# Patient Record
Sex: Female | Born: 1950
Health system: Southern US, Community
[De-identification: ages and names within clinical notes are randomized; demographics above are authoritative.]

## PROBLEM LIST (undated history)

## (undated) DIAGNOSIS — D649 Anemia, unspecified: Secondary | ICD-10-CM

## (undated) DIAGNOSIS — E6609 Other obesity due to excess calories: Secondary | ICD-10-CM

## (undated) DIAGNOSIS — I10 Essential (primary) hypertension: Secondary | ICD-10-CM

## (undated) DIAGNOSIS — E785 Hyperlipidemia, unspecified: Secondary | ICD-10-CM

## (undated) DIAGNOSIS — N951 Menopausal and female climacteric states: Secondary | ICD-10-CM

## (undated) HISTORY — DX: Other obesity due to excess calories: E66.09

## (undated) HISTORY — DX: Hyperlipidemia, unspecified: E78.5

## (undated) HISTORY — DX: Menopausal and female climacteric states: N95.1

## (undated) HISTORY — DX: Anemia, unspecified: D64.9

## (undated) HISTORY — DX: Essential (primary) hypertension: I10

---

## 1976-11-08 HISTORY — PX: CHOLECYSTECTOMY: SHX55

## 2006-07-25 ENCOUNTER — Other Ambulatory Visit: Admission: RE | Admit: 2006-07-25 | Discharge: 2006-07-25 | Payer: Self-pay | Admitting: Family Medicine

## 2006-07-25 ENCOUNTER — Ambulatory Visit: Payer: Self-pay | Admitting: Family Medicine

## 2007-04-17 ENCOUNTER — Ambulatory Visit: Payer: Self-pay | Admitting: Family Medicine

## 2007-10-02 ENCOUNTER — Ambulatory Visit: Payer: Self-pay | Admitting: Family Medicine

## 2007-10-04 ENCOUNTER — Ambulatory Visit: Payer: Self-pay | Admitting: Family Medicine

## 2008-07-01 ENCOUNTER — Ambulatory Visit: Payer: Self-pay | Admitting: Family Medicine

## 2008-12-23 ENCOUNTER — Ambulatory Visit: Payer: Self-pay | Admitting: Family Medicine

## 2009-09-01 ENCOUNTER — Ambulatory Visit: Payer: Self-pay | Admitting: Family Medicine

## 2010-04-13 ENCOUNTER — Other Ambulatory Visit: Admission: RE | Admit: 2010-04-13 | Discharge: 2010-04-13 | Payer: Self-pay | Admitting: Family Medicine

## 2010-04-13 ENCOUNTER — Ambulatory Visit: Payer: Self-pay | Admitting: Family Medicine

## 2010-04-14 LAB — HM PAP SMEAR: HM Pap smear: NEGATIVE

## 2010-04-27 ENCOUNTER — Ambulatory Visit: Payer: Self-pay | Admitting: Family Medicine

## 2010-09-14 ENCOUNTER — Ambulatory Visit: Payer: Self-pay | Admitting: Family Medicine

## 2010-10-19 ENCOUNTER — Ambulatory Visit: Payer: Self-pay | Admitting: Family Medicine

## 2011-07-10 ENCOUNTER — Other Ambulatory Visit: Payer: Self-pay | Admitting: Family Medicine

## 2011-07-26 ENCOUNTER — Telehealth: Payer: Self-pay

## 2011-07-26 NOTE — Telephone Encounter (Signed)
Pt coming in monday

## 2011-07-29 ENCOUNTER — Encounter: Payer: Self-pay | Admitting: Family Medicine

## 2011-08-02 ENCOUNTER — Ambulatory Visit (INDEPENDENT_AMBULATORY_CARE_PROVIDER_SITE_OTHER): Payer: BC Managed Care – PPO | Admitting: Family Medicine

## 2011-08-02 ENCOUNTER — Encounter: Payer: Self-pay | Admitting: Family Medicine

## 2011-08-02 DIAGNOSIS — E1169 Type 2 diabetes mellitus with other specified complication: Secondary | ICD-10-CM

## 2011-08-02 DIAGNOSIS — E785 Hyperlipidemia, unspecified: Secondary | ICD-10-CM

## 2011-08-02 DIAGNOSIS — Z0000 Encounter for general adult medical examination without abnormal findings: Secondary | ICD-10-CM

## 2011-08-02 DIAGNOSIS — E1159 Type 2 diabetes mellitus with other circulatory complications: Secondary | ICD-10-CM | POA: Insufficient documentation

## 2011-08-02 DIAGNOSIS — I152 Hypertension secondary to endocrine disorders: Secondary | ICD-10-CM

## 2011-08-02 DIAGNOSIS — I10 Essential (primary) hypertension: Secondary | ICD-10-CM

## 2011-08-02 DIAGNOSIS — E119 Type 2 diabetes mellitus without complications: Secondary | ICD-10-CM | POA: Insufficient documentation

## 2011-08-02 DIAGNOSIS — Z2911 Encounter for prophylactic immunotherapy for respiratory syncytial virus (RSV): Secondary | ICD-10-CM

## 2011-08-02 DIAGNOSIS — Z23 Encounter for immunization: Secondary | ICD-10-CM

## 2011-08-02 DIAGNOSIS — Z862 Personal history of diseases of the blood and blood-forming organs and certain disorders involving the immune mechanism: Secondary | ICD-10-CM | POA: Insufficient documentation

## 2011-08-02 DIAGNOSIS — Z79899 Other long term (current) drug therapy: Secondary | ICD-10-CM

## 2011-08-02 LAB — POCT URINALYSIS DIPSTICK
Bilirubin, UA: NEGATIVE
Blood, UA: NEGATIVE
Ketones, UA: NEGATIVE
Nitrite, UA: NEGATIVE
pH, UA: 7

## 2011-08-02 LAB — CBC WITH DIFFERENTIAL/PLATELET
Basophils Absolute: 0 10*3/uL (ref 0.0–0.1)
Basophils Relative: 1 % (ref 0–1)
HCT: 36.1 % (ref 36.0–46.0)
Lymphocytes Relative: 37 % (ref 12–46)
Neutro Abs: 3.2 10*3/uL (ref 1.7–7.7)
Neutrophils Relative %: 54 % (ref 43–77)
Platelets: 307 10*3/uL (ref 150–400)
RDW: 13.8 % (ref 11.5–15.5)
WBC: 5.9 10*3/uL (ref 4.0–10.5)

## 2011-08-02 LAB — COMPREHENSIVE METABOLIC PANEL
ALT: 11 U/L (ref 0–35)
AST: 16 U/L (ref 0–37)
Albumin: 4.1 g/dL (ref 3.5–5.2)
Alkaline Phosphatase: 96 U/L (ref 39–117)
Calcium: 9.6 mg/dL (ref 8.4–10.5)
Chloride: 98 mEq/L (ref 96–112)
Potassium: 4.8 mEq/L (ref 3.5–5.3)
Sodium: 136 mEq/L (ref 135–145)

## 2011-08-02 LAB — LIPID PANEL: LDL Cholesterol: 78 mg/dL (ref 0–99)

## 2011-08-02 LAB — POCT UA - MICROALBUMIN
Albumin/Creatinine Ratio, Urine, POC: 17
Creatinine, POC: 117.8 mg/dL
Microalbumin Ur, POC: 20 mg/dL

## 2011-08-02 MED ORDER — PIOGLITAZONE HCL 45 MG PO TABS: 45.0000 mg | ORAL_TABLET | Freq: Every day | ORAL | Status: DC

## 2011-08-02 MED ORDER — METFORMIN HCL 850 MG PO TABS: 850.0000 mg | ORAL_TABLET | Freq: Two times a day (BID) | ORAL | Status: DC

## 2011-08-02 MED ORDER — CARVEDILOL 25 MG PO TABS: 25.0000 mg | ORAL_TABLET | Freq: Two times a day (BID) | ORAL | Status: DC

## 2011-08-02 NOTE — Progress Notes (Signed)
Subjective:    Patient ID: Christy Scott, female    DOB: 1951/02/23, 60 y.o.   MRN: [id number]  HPI She is here for complete examination. She has lost 18 pounds since her last visit. She states her blood sugars run in the low 130 range. She has not been taking her Actos plus regularly stating that it causes a tingling sensation in her hands. She continues on medications listed in the chart and states she is taking all the other meds as scheduled. She continues to drive a truck. Her activity level is limited. Social and family history are reviewed and are in the chart. She does not want a colonoscopy   Review of Systems  Constitutional: Negative.   HENT: Negative.   Eyes: Negative.   Respiratory: Negative.   Cardiovascular: Negative.   Gastrointestinal: Negative.   Genitourinary: Negative.   Musculoskeletal: Negative.   Skin: Negative.   Neurological: Negative.   Hematological: Negative.   Psychiatric/Behavioral: Negative.        Objective:   Physical Exam BP 142/92  Ht 5\' 3"  (1.6 m)  Wt 280 lb (127.007 kg)  BMI 49.60 kg/m2  General Appearance:    Alert, cooperative, no distress, appears stated age  Head:    Normocephalic, without obvious abnormality, atraumatic  Eyes:    PERRL, conjunctiva/corneas clear, EOM's intact, fundi    benign  Ears:    Normal TM's and external ear canals  Nose:   Nares normal, mucosa normal, no drainage or sinus   tenderness  Throat:   Lips, mucosa, and tongue normal; teeth and gums normal  Neck:   Supple, no lymphadenopathy;  thyroid:  no   enlargement/tenderness/nodules; no carotid   bruit or JVD  Back:    Spine nontender, no curvature, ROM normal, no CVA     tenderness  Lungs:     Clear to auscultation bilaterally without wheezes, rales or     ronchi; respirations unlabored  Chest Wall:    No tenderness or deformity   Heart:    Regular rate and rhythm, S1 and S2 normal, no murmur, rub   or gallop  Breast Exam:    Deferred to GYN  Abdomen:      Soft, non-tender, nondistended, normoactive bowel sounds,    no masses, no hepatosplenomegaly  Genitalia:    Deferred to GYN     Extremities:   No clubbing, cyanosis or edema  Pulses:   2+ and symmetric all extremities  Skin:   Skin color, texture, turgor normal, no rashes or lesions  Lymph nodes:   Cervical, supraclavicular, and axillary nodes normal  Neurologic:   CNII-XII intact, normal strength, sensation and gait; reflexes 2+ and symmetric throughout          Psych:   Normal mood, affect, hygiene and grooming.           Assessment & Plan:   1. Diabetes mellitus  POCT Urinalysis Dipstick, POCT HgB A1C, POCT UA - Microalbumin, pioglitazone (ACTOS) 45 MG tablet, metFORMIN (GLUCOPHAGE) 850 MG tablet, CBC with Differential, Comprehensive metabolic panel, Lipid Profile, Ambulatory referral to Ophthalmology  2. Morbid obesity  CBC with Differential, Comprehensive metabolic panel, Lipid Profile  3. Hyperlipidemia LDL goal <70  Lipid Profile  4. Hypertension associated with diabetes  CBC with Differential, Comprehensive metabolic panel, Lipid Profile, carvedilol (COREG) 25 MG tablet  5. History of anemia    6. Encounter for long-term (current) use of other medications  CBC with Differential, Comprehensive metabolic panel, Lipid Profile  7. Routine general medical examination at a health care facility

## 2011-08-02 NOTE — Patient Instructions (Addendum)
Make sure your blood sugar machine is accurate. Check your blood sugar 2 hours after some of your meals. Continue with her weight loss. Work on trying to lose 1 pound per week. Return here in one month for recheck on your blood pressure.

## 2011-08-03 ENCOUNTER — Telehealth: Payer: Self-pay

## 2011-08-03 NOTE — Telephone Encounter (Signed)
Called pt to inform her all labs look good

## 2011-11-05 ENCOUNTER — Other Ambulatory Visit: Payer: Self-pay | Admitting: Family Medicine

## 2011-11-13 ENCOUNTER — Other Ambulatory Visit: Payer: Self-pay | Admitting: Family Medicine

## 2012-01-06 ENCOUNTER — Other Ambulatory Visit: Payer: Self-pay | Admitting: Family Medicine

## 2012-01-21 ENCOUNTER — Other Ambulatory Visit: Payer: Self-pay | Admitting: Family Medicine

## 2012-03-02 ENCOUNTER — Other Ambulatory Visit: Payer: Self-pay | Admitting: Family Medicine

## 2012-03-07 ENCOUNTER — Other Ambulatory Visit: Payer: Self-pay | Admitting: Family Medicine

## 2012-03-11 ENCOUNTER — Other Ambulatory Visit: Payer: Self-pay | Admitting: Family Medicine

## 2012-03-18 ENCOUNTER — Other Ambulatory Visit: Payer: Self-pay | Admitting: Family Medicine

## 2012-04-24 ENCOUNTER — Ambulatory Visit (INDEPENDENT_AMBULATORY_CARE_PROVIDER_SITE_OTHER): Payer: BC Managed Care – PPO | Admitting: Family Medicine

## 2012-04-24 ENCOUNTER — Encounter: Payer: Self-pay | Admitting: Family Medicine

## 2012-04-24 VITALS — BP 150/90 | HR 68 | Ht 63.0 in | Wt 308.0 lb

## 2012-04-24 DIAGNOSIS — E785 Hyperlipidemia, unspecified: Secondary | ICD-10-CM

## 2012-04-24 DIAGNOSIS — I152 Hypertension secondary to endocrine disorders: Secondary | ICD-10-CM

## 2012-04-24 DIAGNOSIS — I10 Essential (primary) hypertension: Secondary | ICD-10-CM

## 2012-04-24 DIAGNOSIS — Z862 Personal history of diseases of the blood and blood-forming organs and certain disorders involving the immune mechanism: Secondary | ICD-10-CM

## 2012-04-24 DIAGNOSIS — E1169 Type 2 diabetes mellitus with other specified complication: Secondary | ICD-10-CM

## 2012-04-24 DIAGNOSIS — E119 Type 2 diabetes mellitus without complications: Secondary | ICD-10-CM

## 2012-04-24 MED ORDER — SITAGLIPTIN PHOS-METFORMIN HCL 50-1000 MG PO TABS: 1.0000 | ORAL_TABLET | Freq: Two times a day (BID) | ORAL | Status: DC

## 2012-04-24 MED ORDER — QUINAPRIL-HYDROCHLOROTHIAZIDE 20-12.5 MG PO TABS: 1.0000 | ORAL_TABLET | Freq: Every day | ORAL | Status: DC

## 2012-04-24 NOTE — Patient Instructions (Addendum)
Stop the Januvia, metformin, hydrochlorothiazide and quinapril and usually her new prescriptions. Call us if you have any questions concerning the right medications to be taking.

## 2012-04-24 NOTE — Progress Notes (Signed)
Subjective:    Patient ID: Christy Scott, female    DOB: [birth year], 61 y.o.   MRN: [id number]  HPI She is here for a complete examination. Her weight has gone up slightly. She continues to work as a Naval architect and therefore her physical activity levels are quite minimal. Her eating habits about this and not changed much. He cannot see a dietitian due to her work schedule and being unable to have the schedule. She does intermittently check her feet. Her social and family history is unchanged. She is considering retiring within the next several years. She did injure her left hand in a fall but has no swelling or deformity.   Review of Systems  Constitutional: Negative.   HENT: Negative.   Eyes: Negative.   Respiratory: Negative.   Cardiovascular: Negative.   Gastrointestinal: Negative.   Genitourinary: Negative.   Musculoskeletal: Negative.   Neurological: Negative.   Hematological: Negative.  Negative for adenopathy.  Psychiatric/Behavioral: Negative.        Objective:   Physical Exam BP 150/90  Pulse 68  Ht 5\' 3"  (1.6 m)  Wt 308 lb (139.708 kg)  BMI 54.56 kg/m2  SpO2 98%  General Appearance:    Alert, cooperative, no distress, appears stated age  Head:    Normocephalic, without obvious abnormality, atraumatic  Eyes:    PERRL, conjunctiva/corneas clear, EOM's intact, fundi    benign  Ears:    Normal TM's and external ear canals  Nose:   Nares normal, mucosa normal, no drainage or sinus   tenderness  Throat:   Lips, mucosa, and tongue normal; teeth and gums normal  Neck:   Supple, no lymphadenopathy;  thyroid:  no   enlargement/tenderness/nodules; no carotid   bruit or JVD  Back:    Spine nontender, no curvature, ROM normal, no CVA     tenderness  Lungs:     Clear to auscultation bilaterally without wheezes, rales or     ronchi; respirations unlabored  Chest Wall:    No tenderness or deformity   Heart:    Regular rate and rhythm, S1 and S2 normal, no murmur, rub   or  gallop  Breast Exam:    Deferred to GYN  Abdomen:     Soft, non-tender, nondistended, normoactive bowel sounds,    no masses, no hepatosplenomegaly  Genitalia:    Deferred to GYN     Extremities:   No clubbing, cyanosis or edema pulses normal   Pulses:   2+ and symmetric all extremities  Skin:   Skin color, texture, turgor normal, no rashes or lesions  Lymph nodes:   Cervical, supraclavicular, and axillary nodes normal  Neurologic:   CNII-XII intact, normal strength, sensation and gait; reflexes 2+ and symmetric throughout          Psych:   Normal mood, affect, hygiene and grooming.           Assessment & Plan:   1. Diabetes mellitus  sitaGLIPtan-metformin (JANUMET) 50-1000 MG per tablet, Ambulatory referral to Ophthalmology  2. Hyperlipidemia LDL goal <70    3. Hypertension associated with diabetes  quinapril-hydrochlorothiazide (ACCURETIC) 20-12.5 MG per tablet  4. Morbid obesity    5. History of anemia     I again discussed the need for her to make dietary changes as well as try to increase her physical activities. I will set her up for ophthalmology evaluation. No therapy for the left hand. I did renew her medications and combined as many as I could.  Told her to call she has no questions about correct dosing of her medicines.

## 2012-05-21 ENCOUNTER — Other Ambulatory Visit: Payer: Self-pay | Admitting: Family Medicine

## 2012-06-19 ENCOUNTER — Ambulatory Visit (INDEPENDENT_AMBULATORY_CARE_PROVIDER_SITE_OTHER): Payer: BC Managed Care – PPO | Admitting: Family Medicine

## 2012-06-19 VITALS — BP 146/90 | HR 70 | Wt 292.0 lb

## 2012-06-19 DIAGNOSIS — I152 Hypertension secondary to endocrine disorders: Secondary | ICD-10-CM

## 2012-06-19 DIAGNOSIS — I10 Essential (primary) hypertension: Secondary | ICD-10-CM

## 2012-06-19 DIAGNOSIS — E1169 Type 2 diabetes mellitus with other specified complication: Secondary | ICD-10-CM

## 2012-06-19 MED ORDER — QUINAPRIL-HYDROCHLOROTHIAZIDE 20-12.5 MG PO TABS: 2.0000 | ORAL_TABLET | ORAL | Status: DC

## 2012-06-19 NOTE — Progress Notes (Signed)
  Subjective:    Patient ID: Christy Scott, female    DOB: 1951/02/15, 61 y.o.   MRN: [id number]  HPI She is here for recheck. She has lost several pounds since her last visit. At this time she is not driving due to her blood pressure. She was to continue to drive for several more years. She continues on medications listed in the chart.   Review of Systems     Objective:   Physical Exam  Alert and in no distress otherwise not examined      Assessment & Plan:   1. Hypertension associated with diabetes  quinapril-hydrochlorothiazide (ACCURETIC) 20-12.5 MG per tablet   I will double her  Accuretic. She is to return her overall medical condition and also to keep her from any not requiring insulin.here in 2 weeks for recheck on her blood pressure. Again encouraged her to continue with her weight loss to help with

## 2012-07-03 ENCOUNTER — Ambulatory Visit (INDEPENDENT_AMBULATORY_CARE_PROVIDER_SITE_OTHER): Payer: BC Managed Care – PPO | Admitting: Family Medicine

## 2012-07-03 VITALS — BP 130/80 | HR 74 | Wt 307.0 lb

## 2012-07-03 DIAGNOSIS — E1169 Type 2 diabetes mellitus with other specified complication: Secondary | ICD-10-CM

## 2012-07-03 DIAGNOSIS — I10 Essential (primary) hypertension: Secondary | ICD-10-CM

## 2012-07-03 DIAGNOSIS — I152 Hypertension secondary to endocrine disorders: Secondary | ICD-10-CM

## 2012-07-03 NOTE — Progress Notes (Signed)
  Subjective:    Patient ID: Christy Scott, female    DOB: 15-Jul-1951, 61 y.o.   MRN: [id number]  HPI She is here for blood pressure recheck. On her last visit we doubled her Accuretic dosing. She is having no difficulty.   Review of Systems     Objective:   Physical Exam Alert and in no distress. Blood pressure is recorded.       Assessment & Plan:  Hypertension. Continue present medication regimen. Recheck here in 4 months.

## 2012-07-03 NOTE — Patient Instructions (Signed)
Stay on your present medications and keep working on the weight

## 2012-07-12 ENCOUNTER — Telehealth: Payer: Self-pay

## 2012-07-12 NOTE — Telephone Encounter (Signed)
Called pt to inform her that the aflac papers needs to sent to the Dr. 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

## 2012-07-17 ENCOUNTER — Other Ambulatory Visit: Payer: Self-pay | Admitting: Family Medicine

## 2012-08-24 ENCOUNTER — Other Ambulatory Visit: Payer: Self-pay | Admitting: Family Medicine

## 2012-10-11 ENCOUNTER — Other Ambulatory Visit: Payer: Self-pay | Admitting: Family Medicine

## 2012-11-07 ENCOUNTER — Other Ambulatory Visit: Payer: Self-pay | Admitting: Family Medicine

## 2012-11-13 ENCOUNTER — Ambulatory Visit: Payer: BC Managed Care – PPO | Admitting: Family Medicine

## 2012-11-28 ENCOUNTER — Telehealth: Payer: Self-pay | Admitting: Internal Medicine

## 2012-11-28 NOTE — Telephone Encounter (Signed)
i called pt, Pt needs to schedule a diabetes check with Dr. Susann Givens

## 2012-12-03 ENCOUNTER — Other Ambulatory Visit: Payer: Self-pay | Admitting: Family Medicine

## 2012-12-25 ENCOUNTER — Ambulatory Visit: Payer: BC Managed Care – PPO | Admitting: Family Medicine

## 2013-01-22 ENCOUNTER — Encounter: Payer: Self-pay | Admitting: Family Medicine

## 2013-01-22 ENCOUNTER — Ambulatory Visit: Payer: BC Managed Care – PPO | Admitting: Family Medicine

## 2013-01-22 VITALS — BP 132/86 | HR 71 | Wt 307.0 lb

## 2013-01-22 DIAGNOSIS — E785 Hyperlipidemia, unspecified: Secondary | ICD-10-CM

## 2013-01-22 DIAGNOSIS — Z862 Personal history of diseases of the blood and blood-forming organs and certain disorders involving the immune mechanism: Secondary | ICD-10-CM

## 2013-01-22 DIAGNOSIS — E119 Type 2 diabetes mellitus without complications: Secondary | ICD-10-CM

## 2013-01-22 DIAGNOSIS — Z79899 Other long term (current) drug therapy: Secondary | ICD-10-CM

## 2013-01-22 DIAGNOSIS — I10 Essential (primary) hypertension: Secondary | ICD-10-CM

## 2013-01-22 DIAGNOSIS — E1159 Type 2 diabetes mellitus with other circulatory complications: Secondary | ICD-10-CM

## 2013-01-22 DIAGNOSIS — I152 Hypertension secondary to endocrine disorders: Secondary | ICD-10-CM

## 2013-01-22 DIAGNOSIS — E1169 Type 2 diabetes mellitus with other specified complication: Secondary | ICD-10-CM

## 2013-01-22 LAB — COMPREHENSIVE METABOLIC PANEL
ALT: 11 U/L (ref 0–35)
AST: 14 U/L (ref 0–37)
CO2: 29 mEq/L (ref 19–32)
Calcium: 9.2 mg/dL (ref 8.4–10.5)
Chloride: 103 mEq/L (ref 96–112)
Creat: 0.78 mg/dL (ref 0.50–1.10)
Sodium: 140 mEq/L (ref 135–145)
Total Protein: 6.9 g/dL (ref 6.0–8.3)

## 2013-01-22 LAB — LIPID PANEL
HDL: 64 mg/dL (ref >39)
LDL Cholesterol: 152 mg/dL — ABNORMAL HIGH (ref 0–99)
Total CHOL/HDL Ratio: 3.7 Ratio
VLDL: 22 mg/dL (ref 0–40)

## 2013-01-22 LAB — CBC WITH DIFFERENTIAL/PLATELET
Basophils Absolute: 0 10*3/uL (ref 0.0–0.1)
Eosinophils Relative: 2 % (ref 0–5)
Lymphocytes Relative: 39 % (ref 12–46)
Lymphs Abs: 1.9 10*3/uL (ref 0.7–4.0)
Neutro Abs: 2.5 10*3/uL (ref 1.7–7.7)
Neutrophils Relative %: 52 % (ref 43–77)
Platelets: 292 10*3/uL (ref 150–400)
RBC: 3.66 MIL/uL — ABNORMAL LOW (ref 3.87–5.11)
RDW: 15.8 % — ABNORMAL HIGH (ref 11.5–15.5)
WBC: 4.8 10*3/uL (ref 4.0–10.5)

## 2013-01-22 LAB — POCT GLYCOSYLATED HEMOGLOBIN (HGB A1C): Hemoglobin A1C: 6.7

## 2013-01-22 LAB — POCT UA - MICROALBUMIN: Microalbumin Ur, POC: 14.7 mg/dL

## 2013-01-22 MED ORDER — QUINAPRIL-HYDROCHLOROTHIAZIDE 20-12.5 MG PO TABS: 2.0000 | ORAL_TABLET | ORAL | Status: DC

## 2013-01-22 MED ORDER — PIOGLITAZONE HCL 45 MG PO TABS: ORAL_TABLET | ORAL | Status: DC

## 2013-01-22 MED ORDER — ROSUVASTATIN CALCIUM 10 MG PO TABS: 10.0000 mg | ORAL_TABLET | Freq: Every day | ORAL | Status: DC

## 2013-01-22 MED ORDER — CARVEDILOL 25 MG PO TABS: ORAL_TABLET | ORAL | Status: DC

## 2013-01-22 MED ORDER — SITAGLIPTIN PHOS-METFORMIN HCL 50-1000 MG PO TABS: ORAL_TABLET | ORAL | Status: DC

## 2013-01-22 NOTE — Progress Notes (Signed)
EYE EXAM : 12/2012 DR.ROTH FOOT EXAM: 04/2012 [facility] PATIENT TEST B/S 1 TIME A DAY B/S READING : 119 OR SO

## 2013-01-22 NOTE — Patient Instructions (Addendum)
Work up to a half an hour of physical activity out of your work day.entirely back on carbohydrates. Remember about "white food". Get out to information that the nutritionist gave you and review it and then implement it! Try the Crestor and let me know if you have trouble with the aches and pains.

## 2013-01-22 NOTE — Progress Notes (Signed)
  Subjective:    Patient ID: Christy Scott, female    DOB: 07/15/1951, 62 y.o.   MRN: [id number]  HPI She is here for a followup diabetes visit. Her weight is stable. Blood pressure is stable. She has had eye exam as well as foot exams. She does test her blood sugars fairly regularly. Her dietary habits are unchanged. She has been through counseling for this. Her exercise is quite minimal. Social history was reviewed and is unchanged. She stopped taking her statin stating it caused muscle aches and pains.  Review of Systems     Objective:   Physical Exam Alert and in no distress. Hemoglobin A1c is 6.7.       Assessment & Plan:  Diabetes mellitus - Plan: POCT glycosylated hemoglobin (Hb A1C), Lipid panel, CBC with Differential, Comprehensive metabolic panel, POCT UA - Microalbumin, sitaGLIPtan-metformin (JANUMET) 50-1000 MG per tablet, pioglitazone (ACTOS) 45 MG tablet  Morbid obesity  Hyperlipidemia LDL goal <70 - Plan: Lipid panel, rosuvastatin (CRESTOR) 10 MG tablet  Hypertension associated with diabetes - Plan: CBC with Differential, Comprehensive metabolic panel, quinapril-hydrochlorothiazide (ACCURETIC) 20-12.5 MG per tablet, carvedilol (COREG) 25 MG tablet  History of anemia  Encounter for long-term (current) use of other medications - Plan: Lipid panel, CBC with Differential, Comprehensive metabolic panel discussed diet and exercise with her. 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 A sample of Crestor given. She is to call me and let he know how she tolerates this.

## 2013-01-23 NOTE — Progress Notes (Signed)
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

## 2013-01-29 ENCOUNTER — Telehealth: Payer: Self-pay | Admitting: Family Medicine

## 2013-01-29 DIAGNOSIS — E785 Hyperlipidemia, unspecified: Secondary | ICD-10-CM

## 2013-01-29 MED ORDER — ROSUVASTATIN CALCIUM 10 MG PO TABS: 10.0000 mg | ORAL_TABLET | Freq: Every day | ORAL | Status: DC

## 2013-01-29 NOTE — Telephone Encounter (Signed)
Sent in crestor.  

## 2013-01-29 NOTE — Telephone Encounter (Signed)
PT CALLED AND STATED THAT CRESTOR SEEMS TO BE WORKING OK. SHE WOULD LIKE A RX SENT INTO [facility].

## 2013-01-30 ENCOUNTER — Other Ambulatory Visit: Payer: Self-pay

## 2013-01-30 DIAGNOSIS — E785 Hyperlipidemia, unspecified: Secondary | ICD-10-CM

## 2013-01-30 MED ORDER — ROSUVASTATIN CALCIUM 10 MG PO TABS: 10.0000 mg | ORAL_TABLET | Freq: Every day | ORAL | Status: DC

## 2013-05-24 ENCOUNTER — Other Ambulatory Visit: Payer: Self-pay | Admitting: Family Medicine

## 2013-12-26 ENCOUNTER — Telehealth: Payer: Self-pay | Admitting: Family Medicine

## 2013-12-26 DIAGNOSIS — E119 Type 2 diabetes mellitus without complications: Secondary | ICD-10-CM

## 2013-12-26 MED ORDER — PIOGLITAZONE HCL 45 MG PO TABS: ORAL_TABLET | ORAL | Status: DC

## 2013-12-26 NOTE — Telephone Encounter (Signed)
Pt needs refill for Actos to [facility].  Pt has med check scheduled for March 30th.

## 2013-12-29 ENCOUNTER — Other Ambulatory Visit: Payer: Self-pay | Admitting: Family Medicine

## 2014-02-03 ENCOUNTER — Other Ambulatory Visit: Payer: Self-pay | Admitting: Family Medicine

## 2014-02-04 ENCOUNTER — Encounter: Payer: BC Managed Care – PPO | Admitting: Family Medicine

## 2014-02-15 ENCOUNTER — Encounter: Payer: BC Managed Care – PPO | Admitting: Family Medicine

## 2014-02-16 ENCOUNTER — Other Ambulatory Visit: Payer: Self-pay | Admitting: Family Medicine

## 2014-03-04 ENCOUNTER — Ambulatory Visit (INDEPENDENT_AMBULATORY_CARE_PROVIDER_SITE_OTHER): Payer: BC Managed Care – PPO | Admitting: Family Medicine

## 2014-03-04 ENCOUNTER — Encounter: Payer: Self-pay | Admitting: Family Medicine

## 2014-03-04 VITALS — BP 148/90 | HR 72 | Wt 291.0 lb

## 2014-03-04 DIAGNOSIS — E119 Type 2 diabetes mellitus without complications: Secondary | ICD-10-CM

## 2014-03-04 DIAGNOSIS — E785 Hyperlipidemia, unspecified: Secondary | ICD-10-CM

## 2014-03-04 DIAGNOSIS — Z79899 Other long term (current) drug therapy: Secondary | ICD-10-CM

## 2014-03-04 DIAGNOSIS — I152 Hypertension secondary to endocrine disorders: Secondary | ICD-10-CM

## 2014-03-04 DIAGNOSIS — I10 Essential (primary) hypertension: Secondary | ICD-10-CM

## 2014-03-04 DIAGNOSIS — E1159 Type 2 diabetes mellitus with other circulatory complications: Secondary | ICD-10-CM

## 2014-03-04 DIAGNOSIS — Z91199 Patient's noncompliance with other medical treatment and regimen due to unspecified reason: Secondary | ICD-10-CM

## 2014-03-04 DIAGNOSIS — Z9119 Patient's noncompliance with other medical treatment and regimen: Secondary | ICD-10-CM

## 2014-03-04 DIAGNOSIS — E1169 Type 2 diabetes mellitus with other specified complication: Secondary | ICD-10-CM

## 2014-03-04 LAB — CBC WITH DIFFERENTIAL/PLATELET
Basophils Absolute: 0 10*3/uL (ref 0.0–0.1)
Basophils Relative: 0 % (ref 0–1)
EOS ABS: 0.1 10*3/uL (ref 0.0–0.7)
Eosinophils Relative: 2 % (ref 0–5)
HCT: 35.2 % — ABNORMAL LOW (ref 36.0–46.0)
Hemoglobin: 11.9 g/dL — ABNORMAL LOW (ref 12.0–15.0)
Lymphocytes Relative: 36 % (ref 12–46)
Lymphs Abs: 1.8 10*3/uL (ref 0.7–4.0)
MCH: 31.2 pg (ref 26.0–34.0)
MCHC: 33.8 g/dL (ref 30.0–36.0)
MCV: 92.1 fL (ref 78.0–100.0)
MONO ABS: 0.3 10*3/uL (ref 0.1–1.0)
Monocytes Relative: 7 % (ref 3–12)
Neutro Abs: 2.7 10*3/uL (ref 1.7–7.7)
Neutrophils Relative %: 55 % (ref 43–77)
PLATELETS: 308 10*3/uL (ref 150–400)
RBC: 3.82 MIL/uL — ABNORMAL LOW (ref 3.87–5.11)
RDW: 15.7 % — AB (ref 11.5–15.5)
WBC: 4.9 10*3/uL (ref 4.0–10.5)

## 2014-03-04 LAB — COMPREHENSIVE METABOLIC PANEL
ALT: 11 U/L (ref 0–35)
AST: 13 U/L (ref 0–37)
Albumin: 4 g/dL (ref 3.5–5.2)
Alkaline Phosphatase: 84 U/L (ref 39–117)
BUN: 14 mg/dL (ref 6–23)
CO2: 28 meq/L (ref 19–32)
CREATININE: 0.74 mg/dL (ref 0.50–1.10)
Calcium: 9.7 mg/dL (ref 8.4–10.5)
Chloride: 102 mEq/L (ref 96–112)
Glucose, Bld: 209 mg/dL — ABNORMAL HIGH (ref 70–99)
Potassium: 4.1 mEq/L (ref 3.5–5.3)
Sodium: 140 mEq/L (ref 135–145)
Total Bilirubin: 0.6 mg/dL (ref 0.2–1.2)
Total Protein: 7.1 g/dL (ref 6.0–8.3)

## 2014-03-04 LAB — LIPID PANEL
CHOL/HDL RATIO: 3.1 ratio
CHOLESTEROL: 211 mg/dL — AB (ref 0–200)
HDL: 68 mg/dL (ref >39)
LDL Cholesterol: 121 mg/dL — ABNORMAL HIGH (ref 0–99)
Triglycerides: 109 mg/dL (ref <150)
VLDL: 22 mg/dL (ref 0–40)

## 2014-03-04 LAB — POCT GLYCOSYLATED HEMOGLOBIN (HGB A1C): Hemoglobin A1C: 8.4

## 2014-03-04 MED ORDER — SITAGLIPTIN PHOS-METFORMIN HCL 50-1000 MG PO TABS: ORAL_TABLET | ORAL | Status: DC

## 2014-03-04 MED ORDER — PIOGLITAZONE HCL 45 MG PO TABS: ORAL_TABLET | ORAL | Status: DC

## 2014-03-04 MED ORDER — QUINAPRIL-HYDROCHLOROTHIAZIDE 20-12.5 MG PO TABS: ORAL_TABLET | ORAL | Status: DC

## 2014-03-04 MED ORDER — CARVEDILOL 25 MG PO TABS: ORAL_TABLET | ORAL | Status: DC

## 2014-03-04 MED ORDER — SIMVASTATIN 20 MG PO TABS: 20.0000 mg | ORAL_TABLET | Freq: Every day | ORAL | Status: DC

## 2014-03-04 NOTE — Patient Instructions (Signed)
If you have any problems with the new medication and let me know

## 2014-03-04 NOTE — Progress Notes (Signed)
   Subjective:    Patient ID: Christy Scott, female    DOB: 02/18/1951, 63 y.o.   MRN: [id number]  HPI She is here for a recheck. She stopped taking Mr. because she had trouble with myalgias but did not call me concerning this and has therefore been off for several months. She also has only been taking 1 January the. She states that she was having difficulty with aches and pains in the evening and was blaming this on the second dosing of the medication. Her weight is down slightly. She missed several appointments due to work related problems specifically travel conditions. Smoking and drinking were reviewed. Her exercise is quite minimal. She is a Naval architecttruck driver. Her chart was reviewed. She has not seen her eye doctor in a while. She is not interested in having a colonoscopy.   Review of Systems Negative except as above    Objective:   Physical Exam Alert and in no distress. Hemoglobin A1c is 8.4.      Assessment & Plan:  Diabetes mellitus - Plan: sitaGLIPtin-metformin (JANUMET) 50-1000 MG per tablet, pioglitazone (ACTOS) 45 MG tablet, CBC with Differential, Comprehensive metabolic panel, Lipid panel, POCT glycosylated hemoglobin (Hb A1C), CANCELED: POCT UA - Microalbumin  Hyperlipidemia LDL goal <70 - Plan: simvastatin (ZOCOR) 20 MG tablet, Lipid panel  Hypertension associated with diabetes - Plan: carvedilol (COREG) 25 MG tablet, quinapril-hydrochlorothiazide (ACCURETIC) 20-12.5 MG per tablet  Morbid obesity  Encounter for long-term (current) use of other medications - Plan: CBC with Differential, Comprehensive metabolic panel, Lipid panel  Personal history of noncompliance with medical treatment, presenting hazards to health  Had a long discussion with her concerning her noncompliance and not letting me know that she stop medications. Strongly encouraged her to involve me in the decision-making she has problems with medications. Her medications were renewed. I will place her on Zocor.  She is to call me she has difficulty with that. If she does I will place her on Livalo.

## 2014-03-05 ENCOUNTER — Other Ambulatory Visit: Payer: Self-pay | Admitting: Family Medicine

## 2014-03-06 LAB — HM MAMMOGRAPHY: HM MAMMO: NEGATIVE

## 2014-03-12 ENCOUNTER — Encounter: Payer: BC Managed Care – PPO | Admitting: Family Medicine

## 2014-03-15 ENCOUNTER — Encounter: Payer: Self-pay | Admitting: Internal Medicine

## 2014-03-15 ENCOUNTER — Encounter: Payer: Self-pay | Admitting: Family Medicine

## 2014-08-29 ENCOUNTER — Other Ambulatory Visit: Payer: Self-pay | Admitting: Family Medicine

## 2014-09-10 ENCOUNTER — Ambulatory Visit: Payer: BC Managed Care – PPO | Admitting: Family Medicine

## 2014-09-30 ENCOUNTER — Encounter: Payer: Self-pay | Admitting: Family Medicine

## 2014-09-30 ENCOUNTER — Ambulatory Visit (INDEPENDENT_AMBULATORY_CARE_PROVIDER_SITE_OTHER): Payer: BC Managed Care – PPO | Admitting: Family Medicine

## 2014-09-30 VITALS — BP 130/90 | HR 86 | Wt 289.0 lb

## 2014-09-30 DIAGNOSIS — E119 Type 2 diabetes mellitus without complications: Secondary | ICD-10-CM

## 2014-09-30 DIAGNOSIS — I152 Hypertension secondary to endocrine disorders: Secondary | ICD-10-CM

## 2014-09-30 DIAGNOSIS — I10 Essential (primary) hypertension: Secondary | ICD-10-CM

## 2014-09-30 DIAGNOSIS — E1169 Type 2 diabetes mellitus with other specified complication: Secondary | ICD-10-CM

## 2014-09-30 DIAGNOSIS — E1159 Type 2 diabetes mellitus with other circulatory complications: Secondary | ICD-10-CM

## 2014-09-30 DIAGNOSIS — S63601A Unspecified sprain of right thumb, initial encounter: Secondary | ICD-10-CM

## 2014-09-30 NOTE — Progress Notes (Signed)
Subjective:     Patient ID: Christy Scott, female   DOB: 11-29-1950, 63 y.o.   MRN: [id number]  HPI  This is a 63 year old female with a history of T2DM & HTN who presents for swelling of her right hand.  Two weeks ago she was carrying bags of groceries to her car and jammed her right, extended thumb into the door.  She noticed worsening swelling with limited ROM last week.  She had minimal pain.  Her status has improved since then and is not noticing pain at this time.  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  Christy Scott is currently adherent to her medications, checking her feet daily, and checking her blood sugar at home.  She had her annual eye exam in April of 2015.  She is trying to adjust her diet by restricting her intake.  She does not have any pain, tingling, or numbness of her extremities. She does not smoke or drink. 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  Review of Systems  Per HPI  Medications, allergies, family history, and social history were reviewed and updated.  Objective:   Physical Exam  Filed Vitals:   09/30/14 1111  BP: 130/90  Pulse: 86    General: Pleasant in no acute distress. CV: Regular rate and rhythm without murmurs gallops or rubs Pulm: Clear to ascultation bilaterally with normal work of breathing. MSK/Extremities: Full range of motion of the thumb with normal strength of opposition to small finger.  No point tenderness over anatomical snuff box.  Point tenderness present over medial aspect of first metacarpal-trapezium joint on the right hand with swelling. Stressing the ulnar collateral ligament was slightly uncomfortable and she did have some slight fullness in that area  Labs today:  A1C 7.4 down from 8.4 in April 2015.     Assessment:     Christy Scott's symptoms and physical exam findings are consistent with a gamekeeper's injury  of the thumb.  Her exam at this point is benign and she should continue to improve.  Her diabetes control has improved with her continued medication compliance.  Her health maintenance is up to date. Again strongly encouraged her to make further diet and exercise changes.    Plan:  Thumb sprain, right, initial encounter - Continue conservative care with cold compresses, NSAIDs for pain.  Hypertension associated with diabetes - Continue current antihypertensives and reassess at next follow-up appointment.  Type 2 diabetes mellitus without complication - Continue current medications and diet modification.  Morbid obesity

## 2014-12-21 ENCOUNTER — Other Ambulatory Visit: Payer: Self-pay | Admitting: Family Medicine

## 2015-01-23 ENCOUNTER — Other Ambulatory Visit: Payer: Self-pay | Admitting: Family Medicine

## 2015-02-15 ENCOUNTER — Other Ambulatory Visit: Payer: Self-pay | Admitting: Family Medicine

## 2015-02-19 ENCOUNTER — Other Ambulatory Visit: Payer: Self-pay | Admitting: Family Medicine

## 2015-02-24 ENCOUNTER — Other Ambulatory Visit: Payer: Self-pay | Admitting: Family Medicine

## 2015-03-10 ENCOUNTER — Ambulatory Visit: Payer: Self-pay | Admitting: Family Medicine

## 2015-03-18 ENCOUNTER — Other Ambulatory Visit: Payer: Self-pay | Admitting: Family Medicine

## 2015-03-18 DIAGNOSIS — E119 Type 2 diabetes mellitus without complications: Secondary | ICD-10-CM

## 2015-03-20 ENCOUNTER — Other Ambulatory Visit: Payer: Self-pay | Admitting: Family Medicine

## 2015-04-17 ENCOUNTER — Other Ambulatory Visit: Payer: Self-pay | Admitting: Family Medicine

## 2015-05-05 ENCOUNTER — Ambulatory Visit (INDEPENDENT_AMBULATORY_CARE_PROVIDER_SITE_OTHER): Payer: No Typology Code available for payment source | Admitting: Family Medicine

## 2015-05-05 ENCOUNTER — Encounter: Payer: Self-pay | Admitting: Family Medicine

## 2015-05-05 VITALS — BP 160/100 | HR 67 | Ht 63.5 in | Wt 299.0 lb

## 2015-05-05 DIAGNOSIS — Z23 Encounter for immunization: Secondary | ICD-10-CM | POA: Diagnosis not present

## 2015-05-05 DIAGNOSIS — I10 Essential (primary) hypertension: Secondary | ICD-10-CM | POA: Diagnosis not present

## 2015-05-05 DIAGNOSIS — E1159 Type 2 diabetes mellitus with other circulatory complications: Secondary | ICD-10-CM

## 2015-05-05 DIAGNOSIS — I152 Hypertension secondary to endocrine disorders: Secondary | ICD-10-CM

## 2015-05-05 DIAGNOSIS — E119 Type 2 diabetes mellitus without complications: Secondary | ICD-10-CM

## 2015-05-05 DIAGNOSIS — Z862 Personal history of diseases of the blood and blood-forming organs and certain disorders involving the immune mechanism: Secondary | ICD-10-CM | POA: Diagnosis not present

## 2015-05-05 DIAGNOSIS — E785 Hyperlipidemia, unspecified: Secondary | ICD-10-CM | POA: Diagnosis not present

## 2015-05-05 DIAGNOSIS — E1169 Type 2 diabetes mellitus with other specified complication: Secondary | ICD-10-CM

## 2015-05-05 LAB — COMPREHENSIVE METABOLIC PANEL
ALK PHOS: 70 U/L (ref 39–117)
ALT: 10 U/L (ref 0–35)
AST: 13 U/L (ref 0–37)
Albumin: 3.9 g/dL (ref 3.5–5.2)
BILIRUBIN TOTAL: 0.5 mg/dL (ref 0.2–1.2)
BUN: 15 mg/dL (ref 6–23)
CO2: 29 mEq/L (ref 19–32)
Calcium: 9.6 mg/dL (ref 8.4–10.5)
Chloride: 102 mEq/L (ref 96–112)
Creat: 1.04 mg/dL (ref 0.50–1.10)
Glucose, Bld: 163 mg/dL — ABNORMAL HIGH (ref 70–99)
Potassium: 4.5 mEq/L (ref 3.5–5.3)
SODIUM: 142 meq/L (ref 135–145)
Total Protein: 7.1 g/dL (ref 6.0–8.3)

## 2015-05-05 LAB — CBC WITH DIFFERENTIAL/PLATELET
Basophils Absolute: 0 10*3/uL (ref 0.0–0.1)
Basophils Relative: 0 % (ref 0–1)
Eosinophils Absolute: 0.2 10*3/uL (ref 0.0–0.7)
Eosinophils Relative: 3 % (ref 0–5)
HCT: 35.5 % — ABNORMAL LOW (ref 36.0–46.0)
HEMOGLOBIN: 11.5 g/dL — AB (ref 12.0–15.0)
LYMPHS ABS: 2.2 10*3/uL (ref 0.7–4.0)
LYMPHS PCT: 41 % (ref 12–46)
MCH: 31 pg (ref 26.0–34.0)
MCHC: 32.4 g/dL (ref 30.0–36.0)
MCV: 95.7 fL (ref 78.0–100.0)
MPV: 10.6 fL (ref 8.6–12.4)
Monocytes Absolute: 0.4 10*3/uL (ref 0.1–1.0)
Monocytes Relative: 8 % (ref 3–12)
Neutro Abs: 2.5 10*3/uL (ref 1.7–7.7)
Neutrophils Relative %: 48 % (ref 43–77)
Platelets: 275 10*3/uL (ref 150–400)
RBC: 3.71 MIL/uL — AB (ref 3.87–5.11)
RDW: 15.7 % — ABNORMAL HIGH (ref 11.5–15.5)
WBC: 5.3 10*3/uL (ref 4.0–10.5)

## 2015-05-05 LAB — LIPID PANEL
Cholesterol: 178 mg/dL (ref 0–200)
HDL: 89 mg/dL (ref >=46)
LDL Cholesterol: 68 mg/dL (ref 0–99)
Total CHOL/HDL Ratio: 2 Ratio
Triglycerides: 104 mg/dL (ref <150)
VLDL: 21 mg/dL (ref 0–40)

## 2015-05-05 LAB — POCT UA - MICROALBUMIN
Albumin/Creatinine Ratio, Urine, POC: 14.6
CREATININE, POC: 151.6 mg/dL
MICROALBUMIN (UR) POC: 22.1 mg/L

## 2015-05-05 LAB — POCT GLYCOSYLATED HEMOGLOBIN (HGB A1C): Hemoglobin A1C: 6.6

## 2015-05-05 MED ORDER — QUINAPRIL HCL 40 MG PO TABS: 40.0000 mg | ORAL_TABLET | Freq: Every day | ORAL | Status: DC

## 2015-05-05 MED ORDER — HYDROCHLOROTHIAZIDE 12.5 MG PO CAPS: 12.5000 mg | ORAL_CAPSULE | Freq: Every day | ORAL | Status: DC

## 2015-05-05 NOTE — Progress Notes (Signed)
  Subjective:    Patient ID: Christy Scott, female    DOB: 09/06/1951, 64 y.o.   MRN: [id number]  Christy Scott is a 64 y.o. female who presents for follow-up of Type 2 diabetes mellitus.  Home blood sugar records: Patient test one time a day 160-190 Current symptoms/problem none Daily foot checks: yes   Any foot concerns: no Exercise: work around house Eye;05/08/15 patient to have eyes check The following portions of the patient's history were reviewed and updated as appropriate: allergies, current medications, past medical history, past social history and problem list.  ROS as in subjective above.     Objective:    Physical Exam Alert and in no distress otherwise not examined.  Lab Review Diabetic Labs Latest Ref Rng 03/04/2014 01/22/2013 08/02/2011  HbA1c - 8.4% 6.7 10.3  Chol 0 - 200 mg/dL 086(V211(H) 784(O238(H) 962182  HDL >39 mg/dL 68 64 76  Calc LDL 0 - 99 mg/dL 952(W121(H) 413(K152(H) 78  Triglycerides <150 mg/dL 440109 102109 725138  Creatinine 0.50 - 1.10 mg/dL 3.660.74 4.400.78 3.470.78   BP/Weight 09/30/2014 03/04/2014 01/22/2013 07/03/2012 06/19/2012  Systolic BP 130 148 132 130 146  Diastolic BP 90 90 86 80 90  Wt. (Lbs) 289 291 307 307 292  BMI 51.21 51.56 54.4 54.4 51.74     Christy Scott  reports that she has never smoked. She has never used smokeless tobacco. She reports that she does not drink alcohol or use illicit drugs. Hemoglobin A1c is 6.6    Assessment & Plan:    Type 2 diabetes mellitus without complication - Plan: POCT glycosylated hemoglobin (Hb A1C), CBC with Differential/Platelet, Comprehensive metabolic panel, Lipid panel, POCT UA - Microalbumin  Morbid obesity - Plan: CBC with Differential/Platelet, Comprehensive metabolic panel, Lipid panel  Hyperlipidemia LDL goal <70 - Plan: Lipid panel  Hypertension associated with diabetes - Plan: quinapril (ACCUPRIL) 40 MG tablet, hydrochlorothiazide (MICROZIDE) 12.5 MG capsule  History of anemia - Plan: CBC with Differential/Platelet  Need for  prophylactic vaccination against Streptococcus pneumoniae (pneumococcus) - Plan: Pneumococcal conjugate vaccine 13-valent     1. Rx changes: I stop quinapril/HCTZ and switch to 40 mg of quinapril and 12-1/2 of HCTZ. 2. Education: Reviewed 'ABCs' of diabetes management (respective goals in parentheses):  A1C (<7), blood pressure (<130/80), and cholesterol (LDL <100). 3. Compliance at present is estimated to be fair. Efforts to improve compliance (if necessary) will be directed at increased exercise. 4. Follow up: 4 months I again discussed the need for her to make major lifestyle changes especially in regard to her weight. She again understands this but so far has been unable to make any changes.

## 2015-05-06 ENCOUNTER — Other Ambulatory Visit: Payer: Self-pay

## 2015-05-06 DIAGNOSIS — E119 Type 2 diabetes mellitus without complications: Secondary | ICD-10-CM

## 2015-05-06 DIAGNOSIS — I10 Essential (primary) hypertension: Principal | ICD-10-CM

## 2015-05-06 DIAGNOSIS — I152 Hypertension secondary to endocrine disorders: Secondary | ICD-10-CM

## 2015-05-06 DIAGNOSIS — E1159 Type 2 diabetes mellitus with other circulatory complications: Secondary | ICD-10-CM

## 2015-05-06 MED ORDER — QUINAPRIL HCL 40 MG PO TABS: 40.0000 mg | ORAL_TABLET | Freq: Every day | ORAL | Status: DC

## 2015-05-06 MED ORDER — SITAGLIPTIN PHOS-METFORMIN HCL 50-1000 MG PO TABS: ORAL_TABLET | ORAL | Status: DC

## 2015-05-06 MED ORDER — CARVEDILOL 25 MG PO TABS: 25.0000 mg | ORAL_TABLET | Freq: Two times a day (BID) | ORAL | Status: DC

## 2015-05-06 MED ORDER — HYDROCHLOROTHIAZIDE 12.5 MG PO CAPS: 12.5000 mg | ORAL_CAPSULE | Freq: Every day | ORAL | Status: DC

## 2015-05-06 MED ORDER — SIMVASTATIN 20 MG PO TABS: ORAL_TABLET | ORAL | Status: DC

## 2015-05-06 MED ORDER — PIOGLITAZONE HCL 45 MG PO TABS: ORAL_TABLET | ORAL | Status: DC

## 2015-06-04 ENCOUNTER — Other Ambulatory Visit: Payer: Self-pay | Admitting: Family Medicine

## 2015-07-05 ENCOUNTER — Other Ambulatory Visit: Payer: Self-pay | Admitting: Family Medicine

## 2015-11-29 ENCOUNTER — Other Ambulatory Visit: Payer: Self-pay | Admitting: Family Medicine

## 2015-11-30 ENCOUNTER — Other Ambulatory Visit: Payer: Self-pay | Admitting: Family Medicine

## 2015-12-01 ENCOUNTER — Other Ambulatory Visit: Payer: Self-pay | Admitting: Family Medicine

## 2015-12-02 NOTE — Telephone Encounter (Signed)
Pt has an appt in march, will supply her another to get to appt

## 2015-12-28 ENCOUNTER — Other Ambulatory Visit: Payer: Self-pay | Admitting: Family Medicine

## 2016-01-06 ENCOUNTER — Other Ambulatory Visit: Payer: Self-pay | Admitting: Family Medicine

## 2016-01-12 ENCOUNTER — Encounter: Payer: Self-pay | Admitting: Family Medicine

## 2016-01-12 ENCOUNTER — Ambulatory Visit (INDEPENDENT_AMBULATORY_CARE_PROVIDER_SITE_OTHER): Payer: No Typology Code available for payment source | Admitting: Family Medicine

## 2016-01-12 VITALS — BP 150/102 | HR 60 | Ht 63.5 in | Wt 311.1 lb

## 2016-01-12 DIAGNOSIS — E119 Type 2 diabetes mellitus without complications: Secondary | ICD-10-CM | POA: Diagnosis not present

## 2016-01-12 DIAGNOSIS — E1159 Type 2 diabetes mellitus with other circulatory complications: Secondary | ICD-10-CM

## 2016-01-12 DIAGNOSIS — Z9119 Patient's noncompliance with other medical treatment and regimen: Secondary | ICD-10-CM | POA: Diagnosis not present

## 2016-01-12 DIAGNOSIS — E1169 Type 2 diabetes mellitus with other specified complication: Secondary | ICD-10-CM

## 2016-01-12 DIAGNOSIS — I10 Essential (primary) hypertension: Secondary | ICD-10-CM | POA: Diagnosis not present

## 2016-01-12 DIAGNOSIS — E785 Hyperlipidemia, unspecified: Secondary | ICD-10-CM | POA: Diagnosis not present

## 2016-01-12 DIAGNOSIS — Z91199 Patient's noncompliance with other medical treatment and regimen due to unspecified reason: Secondary | ICD-10-CM

## 2016-01-12 DIAGNOSIS — I152 Hypertension secondary to endocrine disorders: Secondary | ICD-10-CM

## 2016-01-12 LAB — POCT GLYCOSYLATED HEMOGLOBIN (HGB A1C): HEMOGLOBIN A1C: 6.4

## 2016-01-12 MED ORDER — AZILSARTAN-CHLORTHALIDONE 40-25 MG PO TABS: 1.0000 | ORAL_TABLET | Freq: Every day | ORAL | Status: DC

## 2016-01-12 NOTE — Patient Instructions (Addendum)
Start on the new medicine and bring your blood pressure cuff in in 1 month and we'll compare the two. Stop the hydrochlorothiazide and quinapril but continue on the Coreg 20 minutes a day of exercise

## 2016-01-12 NOTE — Progress Notes (Signed)
  Subjective:    Patient ID: Christy Scott, female    DOB: 09-Jun-1951, 65 y.o.   MRN: [id number]  Christy Scott is a 65 y.o. female who presents for follow-up of Type 2 diabetes mellitus.  Home blood sugar records: fasting range: 101 to 160 Current symptoms/problems include none and have been stable. Daily foot checks:   Any foot concerns: none Exercise: The patient does not participate in regular exercise at present. Diet:none The following portions of the patient's history were reviewed and updated as appropriate: allergies, current medications, past medical history, past social history and problem list.  ROS as in subjective above.     Objective:    Physical Exam Alert and in no distress otherwise not examined.  Blood pressure 150/102, pulse 60, height 5' 3.5" (1.613 m), weight 311 lb 2 oz (141.125 kg), SpO2 98 %.  Lab Review Diabetic Labs Latest Ref Rng 05/05/2015 03/04/2014 01/22/2013 08/02/2011  HbA1c - 6.6 8.4% 6.7 10.3  Chol 0 - 200 mg/dL 409178 811(B211(H) 147(W238(H) 295182  HDL >=46 mg/dL 89 68 64 76  Calc LDL 0 - 99 mg/dL 68 621(H121(H) 086(V152(H) 78  Triglycerides <150 mg/dL 784104 696109 295109 284138  Creatinine 0.50 - 1.10 mg/dL 1.321.04 4.400.74 1.020.78 7.250.78   BP/Weight 01/12/2016 05/05/2015 09/30/2014 03/04/2014 01/22/2013  Systolic BP 150 160 130 148 132  Diastolic BP 102 100 90 90 86  Wt. (Lbs) 311.13 299 289 291 307  BMI 54.24 52.13 51.21 51.56 54.4   Foot/eye exam completion dates 05/05/2015  Foot Form Completion Done   A1c 6.4 [name]  reports that she has never smoked. She has never used smokeless tobacco. She reports that she does not drink alcohol or use illicit drugs.     Assessment & Plan:    Type 2 diabetes mellitus without complication, unspecified long term insulin use status (HCC) - Plan: POCT glycosylated hemoglobin (Hb A1C)  Morbid obesity due to excess calories (HCC)  Hyperlipidemia associated with type 2 diabetes mellitus (HCC)  Hypertension associated with diabetes (HCC) - Plan:  Azilsartan-Chlorthalidone 40-25 MG TABS  Personal history of noncompliance with medical treatment, presenting hazards to health   1. Rx changes: Edarbyclor 2. Education: Reviewed 'ABCs' of diabetes management (respective goals in parentheses):  A1C (<7), blood pressure (<130/80), and cholesterol (LDL <100). 3. Compliance at present is estimated to be poor. Efforts to improve compliance (if necessary) will be directed at increased exercise. 4. Follow up: 4 months I again reinforced the fact that she needs to do a better job of both with her diet and exercise. Of note is fact that she has actually gained weight. Explained the [facility] in the hope that this will help. She is to bring in her blood pressure cuff in 1 month and we will measure it against ours.

## 2016-02-25 ENCOUNTER — Other Ambulatory Visit: Payer: Self-pay | Admitting: Family Medicine

## 2016-03-01 LAB — HM DIABETES EYE EXAM

## 2016-03-11 ENCOUNTER — Other Ambulatory Visit: Payer: Self-pay | Admitting: Family Medicine

## 2016-03-26 ENCOUNTER — Other Ambulatory Visit: Payer: Self-pay | Admitting: Family Medicine

## 2016-03-27 ENCOUNTER — Other Ambulatory Visit: Payer: Self-pay | Admitting: Family Medicine

## 2016-03-30 ENCOUNTER — Other Ambulatory Visit: Payer: Self-pay | Admitting: Family Medicine

## 2016-04-06 ENCOUNTER — Encounter: Payer: Self-pay | Admitting: Family Medicine

## 2016-04-06 ENCOUNTER — Ambulatory Visit (INDEPENDENT_AMBULATORY_CARE_PROVIDER_SITE_OTHER): Payer: Medicare Other | Admitting: Family Medicine

## 2016-04-06 VITALS — BP 140/86 | HR 70 | Wt 318.0 lb

## 2016-04-06 DIAGNOSIS — E1159 Type 2 diabetes mellitus with other circulatory complications: Secondary | ICD-10-CM | POA: Diagnosis not present

## 2016-04-06 DIAGNOSIS — I10 Essential (primary) hypertension: Secondary | ICD-10-CM | POA: Diagnosis not present

## 2016-04-06 DIAGNOSIS — I152 Hypertension secondary to endocrine disorders: Secondary | ICD-10-CM

## 2016-04-06 DIAGNOSIS — E119 Type 2 diabetes mellitus without complications: Secondary | ICD-10-CM | POA: Diagnosis not present

## 2016-04-06 DIAGNOSIS — Z9119 Patient's noncompliance with other medical treatment and regimen: Secondary | ICD-10-CM | POA: Diagnosis not present

## 2016-04-06 DIAGNOSIS — Z91199 Patient's noncompliance with other medical treatment and regimen due to unspecified reason: Secondary | ICD-10-CM

## 2016-04-06 NOTE — Progress Notes (Signed)
   Subjective:    Patient ID: Christy CableErnestine Scott, female    DOB: 06-02-1951, 65 y.o.   MRN: [id number]  HPI She is here for a recheck. She did bring her blood pressure cuff which is a wrist cuff. It was compared ours and there is a huge difference. 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 She also plans to make some dietary changes. I discussed going back to diabetes and nutrition however she declined this.   Review of Systems     Objective:   Physical Exam Alert and in no distress. Blood pressure is recorded.       Assessment & Plan:  Type 2 diabetes mellitus without complication, unspecified long term insulin use status (HCC)  Morbid obesity due to excess calories (HCC)  Hypertension associated with diabetes (HCC)  Personal history of noncompliance with medical treatment, presenting hazards to health Discussed diet and exercise with her and the fact she is now retired, she has no reason to not take better care of herself. Recommended at least 20 minutes everyday of physical activity also encouraged her to look back over her information concerning diabetes and nutrition and make further changes. Plan the touch bases with her in 4 months. If no improvement, we will then readdress this issue and become more proactive.

## 2016-04-06 NOTE — Patient Instructions (Signed)
Keep your mind and body busy. At least 20 minutes of something physical every day. Get the information out when he went to the diabetes program and use it. try reading while you're on a treadmill

## 2016-04-09 ENCOUNTER — Other Ambulatory Visit: Payer: Self-pay | Admitting: Family Medicine

## 2016-05-12 ENCOUNTER — Other Ambulatory Visit: Payer: Self-pay | Admitting: Family Medicine

## 2016-07-30 ENCOUNTER — Other Ambulatory Visit: Payer: Self-pay | Admitting: Family Medicine

## 2016-08-09 ENCOUNTER — Encounter: Payer: Self-pay | Admitting: Family Medicine

## 2016-08-09 ENCOUNTER — Ambulatory Visit (INDEPENDENT_AMBULATORY_CARE_PROVIDER_SITE_OTHER): Payer: Medicare Other | Admitting: Family Medicine

## 2016-08-09 VITALS — BP 126/80 | HR 93 | Ht 63.0 in | Wt 311.8 lb

## 2016-08-09 DIAGNOSIS — I10 Essential (primary) hypertension: Secondary | ICD-10-CM | POA: Diagnosis not present

## 2016-08-09 DIAGNOSIS — E119 Type 2 diabetes mellitus without complications: Secondary | ICD-10-CM

## 2016-08-09 DIAGNOSIS — Z1231 Encounter for screening mammogram for malignant neoplasm of breast: Secondary | ICD-10-CM | POA: Diagnosis not present

## 2016-08-09 DIAGNOSIS — Z1211 Encounter for screening for malignant neoplasm of colon: Secondary | ICD-10-CM | POA: Diagnosis not present

## 2016-08-09 DIAGNOSIS — E1159 Type 2 diabetes mellitus with other circulatory complications: Secondary | ICD-10-CM

## 2016-08-09 DIAGNOSIS — I152 Hypertension secondary to endocrine disorders: Secondary | ICD-10-CM

## 2016-08-09 DIAGNOSIS — Z23 Encounter for immunization: Secondary | ICD-10-CM | POA: Diagnosis not present

## 2016-08-09 DIAGNOSIS — E785 Hyperlipidemia, unspecified: Secondary | ICD-10-CM | POA: Diagnosis not present

## 2016-08-09 DIAGNOSIS — Z1239 Encounter for other screening for malignant neoplasm of breast: Secondary | ICD-10-CM

## 2016-08-09 DIAGNOSIS — E1169 Type 2 diabetes mellitus with other specified complication: Secondary | ICD-10-CM | POA: Diagnosis not present

## 2016-08-09 LAB — CBC WITH DIFFERENTIAL/PLATELET
Basophils Absolute: 51 cells/uL (ref 0–200)
Basophils Relative: 1 %
EOS ABS: 153 {cells}/uL (ref 15–500)
Eosinophils Relative: 3 %
HEMATOCRIT: 33.1 % — AB (ref 35.0–45.0)
HEMOGLOBIN: 10.6 g/dL — AB (ref 11.7–15.5)
LYMPHS ABS: 1887 {cells}/uL (ref 850–3900)
Lymphocytes Relative: 37 %
MCH: 30.5 pg (ref 27.0–33.0)
MCHC: 32 g/dL (ref 32.0–36.0)
MCV: 95.1 fL (ref 80.0–100.0)
MONO ABS: 357 {cells}/uL (ref 200–950)
MPV: 9.7 fL (ref 7.5–12.5)
Monocytes Relative: 7 %
NEUTROS PCT: 52 %
Neutro Abs: 2652 cells/uL (ref 1500–7800)
Platelets: 257 10*3/uL (ref 140–400)
RBC: 3.48 MIL/uL — AB (ref 3.80–5.10)
RDW: 15.6 % — ABNORMAL HIGH (ref 11.0–15.0)
WBC: 5.1 10*3/uL (ref 4.0–10.5)

## 2016-08-09 LAB — LIPID PANEL
Cholesterol: 166 mg/dL (ref 125–200)
HDL: 75 mg/dL (ref >=46)
LDL Cholesterol: 64 mg/dL (ref <130)
TRIGLYCERIDES: 136 mg/dL (ref <150)
Total CHOL/HDL Ratio: 2.2 Ratio (ref <=5.0)
VLDL: 27 mg/dL (ref <30)

## 2016-08-09 LAB — COMPREHENSIVE METABOLIC PANEL
ALBUMIN: 3.9 g/dL (ref 3.6–5.1)
ALK PHOS: 59 U/L (ref 33–130)
ALT: 7 U/L (ref 6–29)
AST: 13 U/L (ref 10–35)
BILIRUBIN TOTAL: 0.5 mg/dL (ref 0.2–1.2)
BUN: 14 mg/dL (ref 7–25)
CALCIUM: 9.2 mg/dL (ref 8.6–10.4)
CO2: 26 mmol/L (ref 20–31)
Chloride: 106 mmol/L (ref 98–110)
Creat: 1.03 mg/dL — ABNORMAL HIGH (ref 0.50–0.99)
Glucose, Bld: 127 mg/dL — ABNORMAL HIGH (ref 65–99)
Potassium: 4.3 mmol/L (ref 3.5–5.3)
Sodium: 141 mmol/L (ref 135–146)
Total Protein: 6.9 g/dL (ref 6.1–8.1)

## 2016-08-09 LAB — POCT GLYCOSYLATED HEMOGLOBIN (HGB A1C): HEMOGLOBIN A1C: 6.6

## 2016-08-09 LAB — POCT UA - MICROALBUMIN
Albumin/Creatinine Ratio, Urine, POC: 8.5
Creatinine, POC: 154.5 mg/dL
Microalbumin Ur, POC: 13.2 mg/L

## 2016-08-09 NOTE — Progress Notes (Signed)
  Subjective:    Patient ID: Christy Scott, female    DOB: [birth year], 65 y.o.   MRN: [id number]  Christy Cablernestine Boley is a 65 y.o. female who presents for follow-up of Type 2 diabetes mellitus.  Patient is checking home blood sugars.   Home blood sugar records: 158 down to 119 How often is blood sugars being checked: once every other day Current symptoms/problems none Daily foot checks: yes   Any foot concerns: none Last eye exam: 02/2016 Exercise: walking puppyIn her backyard. Otherwise she is not doing much in way of physical activity. She is now retired from [employer]. Her eating habits have been unchanged. She continues on simvastatin and is having no muscle aches or pains. Her blood pressure medicines at working well and she has no difficulty with them. She continues on her diabetes meds and has no complaints. The following portions of the patient's history were reviewed and updated as appropriate: allergies, current medications, past medical history, past social history and problem list.  ROS as in subjective above.     Objective:    Physical Exam Alert and in no distress otherwise not examined.Heart exam shows regular rhythm without murmurs or gallops. Lungs are clear to auscultation.   Lab Review Diabetic Labs Latest Ref Rng & Units 01/12/2016 05/05/2015 03/04/2014 01/22/2013 08/02/2011  HbA1c - 6.4 6.6 8.4% 6.7 10.3  Microalbumin mg/L - 22.1 - 14.7 20.0  Micro/Creat Ratio - - 14.6 - 12.8 17.0  Chol 0 - 200 mg/dL - 409178 811(B211(H) 147(W238(H) 295182  HDL >=46 mg/dL - 89 68 64 76  Calc LDL 0 - 99 mg/dL - 68 621(H121(H) 086(V152(H) 78  Triglycerides <150 mg/dL - 784104 696109 295109 284138  Creatinine 0.50 - 1.10 mg/dL - 1.321.04 4.400.74 1.020.78 7.250.78   BP/Weight 04/06/2016 01/12/2016 05/05/2015 09/30/2014 03/04/2014  Systolic BP 140 150 160 130 148  Diastolic BP 86 102 100 90 90  Wt. (Lbs) 318 311.13 299 289 291  BMI 55.44 54.24 52.13 51.21 51.56   Foot/eye exam completion dates Latest Ref Rng & Units 03/01/2016  05/05/2015  Eye Exam No Retinopathy No Retinopathy -  Foot Form Completion - - Done   A1c is 6.6 Deshannon  reports that she has never smoked. She has never used smokeless tobacco. She reports that she does not drink alcohol or use drugs.     Assessment & Plan:    Type 2 diabetes mellitus without complication, unspecified long term insulin use status (HCC) - Plan: HgB A1c, POCT UA - Microalbumin, CBC with Differential/Platelet, Comprehensive metabolic panel, Lipid panel  Morbid obesity due to excess calories (HCC)  Hypertension associated with diabetes (HCC) - Plan: CBC with Differential/Platelet, Comprehensive metabolic panel  Hyperlipidemia associated with type 2 diabetes mellitus (HCC) - Plan: Lipid panel  Screening for breast cancer - Plan: MM DIGITAL SCREENING BILATERAL  Screening for colon cancer - Plan: Cologuard  Need for prophylactic vaccination and inoculation against influenza - Plan: Flu vaccine HIGH DOSE PF (Fluzone High dose)   1. Rx changes: none 2. Education: Reviewed 'ABCs' of diabetes management (respective goals in parentheses):  A1C (<7), blood pressure (<130/80), and cholesterol (LDL <100). 3. Compliance at present is estimated to be good. Efforts to improve compliance (if necessary) will be directed at increased exercise. 4. Follow up: 4 months  Also discussed the need for her to come more physically active recommend 20 minutes of something physical on a daily basis.

## 2016-08-09 NOTE — Patient Instructions (Signed)
20 minutes daily of something physical Cut back on white food 

## 2016-08-17 DIAGNOSIS — Z1212 Encounter for screening for malignant neoplasm of rectum: Secondary | ICD-10-CM | POA: Diagnosis not present

## 2016-08-17 DIAGNOSIS — Z1211 Encounter for screening for malignant neoplasm of colon: Secondary | ICD-10-CM | POA: Diagnosis not present

## 2016-08-17 LAB — COLOGUARD: Cologuard: NEGATIVE

## 2016-08-25 ENCOUNTER — Encounter: Payer: Self-pay | Admitting: Family Medicine

## 2016-09-15 ENCOUNTER — Other Ambulatory Visit: Payer: Self-pay | Admitting: Family Medicine

## 2016-09-26 ENCOUNTER — Other Ambulatory Visit: Payer: Self-pay | Admitting: Family Medicine

## 2016-09-27 DIAGNOSIS — Z1231 Encounter for screening mammogram for malignant neoplasm of breast: Secondary | ICD-10-CM | POA: Diagnosis not present

## 2016-09-27 LAB — HM MAMMOGRAPHY

## 2016-09-28 ENCOUNTER — Encounter: Payer: Self-pay | Admitting: Family Medicine

## 2016-09-28 NOTE — Progress Notes (Signed)
a 

## 2016-09-29 ENCOUNTER — Telehealth: Payer: Self-pay | Admitting: Medical

## 2016-09-29 NOTE — Telephone Encounter (Signed)
Mammogram normal/no worrisome findings. 

## 2016-09-29 NOTE — Telephone Encounter (Signed)
Called l/m on her voicemail

## 2016-12-01 ENCOUNTER — Other Ambulatory Visit: Payer: Self-pay | Admitting: Family Medicine

## 2016-12-12 ENCOUNTER — Other Ambulatory Visit: Payer: Self-pay | Admitting: Family Medicine

## 2017-01-09 ENCOUNTER — Other Ambulatory Visit: Payer: Self-pay | Admitting: Family Medicine

## 2017-01-09 DIAGNOSIS — I152 Hypertension secondary to endocrine disorders: Secondary | ICD-10-CM

## 2017-01-09 DIAGNOSIS — I10 Essential (primary) hypertension: Principal | ICD-10-CM

## 2017-01-09 DIAGNOSIS — E1159 Type 2 diabetes mellitus with other circulatory complications: Secondary | ICD-10-CM

## 2017-01-10 ENCOUNTER — Telehealth: Payer: Self-pay | Admitting: Medical

## 2017-01-10 NOTE — Telephone Encounter (Signed)
Pt states [facility]95 a [facility].  [facility] no longer applies [facility] [facility] (pt's over 65).  Do you want to switch to something more affordable ?

## 2017-01-11 ENCOUNTER — Ambulatory Visit (INDEPENDENT_AMBULATORY_CARE_PROVIDER_SITE_OTHER): Payer: Medicare Other | Admitting: Family Medicine

## 2017-01-11 ENCOUNTER — Encounter: Payer: Self-pay | Admitting: Family Medicine

## 2017-01-11 VITALS — BP 140/90 | HR 70 | Wt 322.0 lb

## 2017-01-11 DIAGNOSIS — E1159 Type 2 diabetes mellitus with other circulatory complications: Secondary | ICD-10-CM

## 2017-01-11 DIAGNOSIS — I152 Hypertension secondary to endocrine disorders: Secondary | ICD-10-CM

## 2017-01-11 DIAGNOSIS — I10 Essential (primary) hypertension: Secondary | ICD-10-CM | POA: Diagnosis not present

## 2017-01-11 MED ORDER — LOSARTAN POTASSIUM-HCTZ 100-12.5 MG PO TABS: 1.0000 | ORAL_TABLET | Freq: Every day | ORAL | 3 refills | Status: DC

## 2017-01-11 MED ORDER — AMLODIPINE BESYLATE 5 MG PO TABS: 5.0000 mg | ORAL_TABLET | Freq: Every day | ORAL | 3 refills | Status: DC

## 2017-01-11 NOTE — Progress Notes (Signed)
   Subjective:    Patient ID: Christy CableErnestine Scott, female    DOB: 04-23-1951, 66 y.o.   MRN: [id number]  HPI She is here for recheck on her blood pressure. She is now on Medicare and it would not cover for her previous medication of Edarbyclor. She admits to not taking care of herself. Her weight is actually up from her last visit.   Review of Systems     Objective:   Physical Exam Alert and in no distress. Blood pressure and weight are recorded.       Assessment & Plan:  Hypertension associated with diabetes (HCC) - Plan: losartan-hydrochlorothiazide (HYZAAR) 100-12.5 MG tablet, amLODipine (NORVASC) 5 MG tablet  Morbid obesity due to excess calories (HCC) Switch her to a new blood pressure regimen. Strongly encouraged her to make further diet and exercise changes. She is to return here in one month for a diabetes recheck including blood pressure.

## 2017-01-11 NOTE — Telephone Encounter (Signed)
It's time for a follow-up visit for her anyway. Set it up

## 2017-01-11 NOTE — Telephone Encounter (Signed)
Pt has appointment today.  

## 2017-02-07 ENCOUNTER — Ambulatory Visit: Payer: Medicare Other | Admitting: Family Medicine

## 2017-03-14 ENCOUNTER — Ambulatory Visit: Payer: Medicare Other | Admitting: Family Medicine

## 2017-03-23 ENCOUNTER — Other Ambulatory Visit: Payer: Self-pay | Admitting: Family Medicine

## 2017-05-24 ENCOUNTER — Ambulatory Visit (INDEPENDENT_AMBULATORY_CARE_PROVIDER_SITE_OTHER): Payer: Medicare Other | Admitting: Family Medicine

## 2017-05-24 ENCOUNTER — Encounter: Payer: Self-pay | Admitting: Family Medicine

## 2017-05-24 VITALS — BP 136/88 | HR 80 | Ht 63.0 in | Wt 317.0 lb

## 2017-05-24 DIAGNOSIS — E785 Hyperlipidemia, unspecified: Secondary | ICD-10-CM | POA: Diagnosis not present

## 2017-05-24 DIAGNOSIS — I152 Hypertension secondary to endocrine disorders: Secondary | ICD-10-CM

## 2017-05-24 DIAGNOSIS — Z862 Personal history of diseases of the blood and blood-forming organs and certain disorders involving the immune mechanism: Secondary | ICD-10-CM

## 2017-05-24 DIAGNOSIS — E1159 Type 2 diabetes mellitus with other circulatory complications: Secondary | ICD-10-CM

## 2017-05-24 DIAGNOSIS — Z13820 Encounter for screening for osteoporosis: Secondary | ICD-10-CM | POA: Diagnosis not present

## 2017-05-24 DIAGNOSIS — I10 Essential (primary) hypertension: Secondary | ICD-10-CM

## 2017-05-24 DIAGNOSIS — Z0000 Encounter for general adult medical examination without abnormal findings: Secondary | ICD-10-CM | POA: Diagnosis not present

## 2017-05-24 DIAGNOSIS — E119 Type 2 diabetes mellitus without complications: Secondary | ICD-10-CM | POA: Diagnosis not present

## 2017-05-24 DIAGNOSIS — E1169 Type 2 diabetes mellitus with other specified complication: Secondary | ICD-10-CM | POA: Diagnosis not present

## 2017-05-24 LAB — POCT URINALYSIS DIP (PROADVANTAGE DEVICE)
BILIRUBIN UA: NEGATIVE
GLUCOSE UA: NEGATIVE mg/dL
Ketones, POC UA: NEGATIVE mg/dL
Leukocytes, UA: NEGATIVE
Nitrite, UA: NEGATIVE
Protein Ur, POC: NEGATIVE mg/dL
RBC UA: NEGATIVE
Specific Gravity, Urine: 1.015
Urobilinogen, Ur: NEGATIVE
pH, UA: 7.5 (ref 5.0–8.0)

## 2017-05-24 LAB — CBC WITH DIFFERENTIAL/PLATELET
BASOS PCT: 0 %
Basophils Absolute: 0 cells/uL (ref 0–200)
EOS PCT: 2 %
Eosinophils Absolute: 92 cells/uL (ref 15–500)
HEMATOCRIT: 32.6 % — AB (ref 35.0–45.0)
HEMOGLOBIN: 10.6 g/dL — AB (ref 11.7–15.5)
LYMPHS ABS: 1656 {cells}/uL (ref 850–3900)
Lymphocytes Relative: 36 %
MCH: 31.1 pg (ref 27.0–33.0)
MCHC: 32.5 g/dL (ref 32.0–36.0)
MCV: 95.6 fL (ref 80.0–100.0)
MONO ABS: 276 {cells}/uL (ref 200–950)
MPV: 10 fL (ref 7.5–12.5)
Monocytes Relative: 6 %
NEUTROS ABS: 2576 {cells}/uL (ref 1500–7800)
Neutrophils Relative %: 56 %
Platelets: 286 10*3/uL (ref 140–400)
RBC: 3.41 MIL/uL — AB (ref 3.80–5.10)
RDW: 15.4 % — ABNORMAL HIGH (ref 11.0–15.0)
WBC: 4.6 10*3/uL (ref 4.0–10.5)

## 2017-05-24 LAB — POCT GLYCOSYLATED HEMOGLOBIN (HGB A1C): HEMOGLOBIN A1C: 6.3

## 2017-05-24 NOTE — Progress Notes (Signed)
Subjective:   HPI  Christy Scott is a 66 y.o. female who presents for Chief Complaint  Patient presents with  . Medicare Wellness  . Annual Exam    Medical care team includes: Ronnald Nian, MD here for primary care   preventative care: Susann Givens Last ophthalmology visit: 02/2016. Recommend I follow-up exam. Last dental visit: 2016 Last colonoscopy: colog. 08/25/16 Last mammogram:09/27/16 Last pap: 04/14/10 Last EKG: Last labs:08/09/16  Prior vaccinations:  TD or Tdap:12/23/08 Influenza: Instructed to get one in the fall Pneumococcal:10/02/07 13:05/05/15 Shingles/Zostavax:08/02/11 discussion for Shingrix written  Advanced directive: No. Information given. Health care power of attorney: Discussed with patient She is here for a complete examination. She is now retired and has started an exercise program she actually showed me her exercise program which is steps. She is also using her that bit and is now roughly at 6000 steps per day. She has lost approximately 5 pounds. She continues on amlodipine and thinks it has cause some fluid retention. She is also taking Coreg as well as Hyzaar for her blood pressure. Continues on simvastatin and having no aches and pains with that. She is also taking Actos, Janumet and monitoring her blood sugars. She is now taking an iron supplement. She is postmenopausal and has not had a DEXA scan. She does not smoke or drink. She has no other concerns or complaints.  Reviewed their medical, surgical, family, social, medication, and allergy history and updated chart as appropriate.  Past Medical History:  Diagnosis Date  . Anemia   . Diabetes mellitus    TYPEII  . Dyslipidemia   . Exogenous obesity   . Hyperlipidemia   . Hypertension   . Menopausal symptoms     Past Surgical History:  Procedure Laterality Date  . CHOLECYSTECTOMY  1978    Social History   Social History  . Marital status: Single    Spouse name: N/A  . Number of children:  N/A  . Years of education: N/A   Occupational History  . Not on file.   Social History Main Topics  . Smoking status: Never Smoker  . Smokeless tobacco: Never Used  . Alcohol use No  . Drug use: No  . Sexual activity: Not Currently   Other Topics Concern  . Not on file   Social History Narrative  . No narrative on file    Family History  Problem Relation Age of Onset  . Diabetes Mother   . Hypertension Mother   . Hypertension Father   . Stroke Father      Current Outpatient Prescriptions:  .  amLODipine (NORVASC) 5 MG tablet, Take 1 tablet (5 mg total) by mouth daily., Disp: 90 tablet, Rfl: 3 .  carvedilol (COREG) 25 MG tablet, TAKE 1 TABLET BY MOUTH TWICE DAILY, Disp: 180 tablet, Rfl: 3 .  JANUMET 50-1000 MG tablet, TAKE 1 TABLET BY MOUTH TWICE DAILY WITH A MEAL, Disp: 180 tablet, Rfl: 0 .  losartan-hydrochlorothiazide (HYZAAR) 100-12.5 MG tablet, Take 1 tablet by mouth daily., Disp: 90 tablet, Rfl: 3 .  pioglitazone (ACTOS) 45 MG tablet, TAKE 1 TABLET BY MOUTH DAILY, Disp: 90 tablet, Rfl: 4 .  simvastatin (ZOCOR) 20 MG tablet, TAKE 1 TABLET (20 MG TOTAL) BY MOUTH AT BEDTIME., Disp: 90 tablet, Rfl: 3  No Known Allergies     Review of Systems Negative except as above    Objective:   General appearance: alert, no distress, WD/WN, African American female Skin: Normal HEENT: normocephalic, conjunctiva/corneas normal, sclerae  anicteric, PERRLA, EOMi, nares patent, no discharge or erythema, pharynx normal Oral cavity: MMM, tongue normal, teeth normal Neck: supple, no lymphadenopathy, no thyromegaly, no masses, normal ROM, no bruits eart: RRR, normal S1, S2, no murmurs Lungs: CTA bilaterally, no wheezes, rhonchi, or rales Abdomen: +bs, soft, non tender, non distended, no masses, no hepatomegaly, no splenomegaly, no bruits Musculoskeletal: upper extremities non tender, no obvious deformity, normal ROM throughout, lower extremities non tender, no obvious deformity,  normal ROM throughout Extremities: no edema, no cyanosis, no clubbing Pulses: 2+ symmetric, upper and lower extremities, normal cap refill Neurological: alert, oriented x 3, CN2-12 intact, strength normal upper extremities and lower extremities, sensation normal throughout, DTRs 2+ throughout, no cerebellar signs, gait normal Psychiatric: normal affect, behavior normal, pleasant     Assessment and Plan :    Routine general medical examination at a health care facility - Plan: POCT Urinalysis DIP (Proadvantage Device), DG Bone Density, CBC with Differential/Platelet  Screening for osteoporosis  Hypertension associated with diabetes (HCC) - Plan: POCT Urinalysis DIP (Proadvantage Device)  Morbid obesity due to excess calories (HCC)  Hyperlipidemia associated with type 2 diabetes mellitus (HCC)  Type 2 diabetes mellitus without complication, unspecified whether long term insulin use (HCC) - Plan: POCT Urinalysis DIP (Proadvantage Device), HgB A1c  Morbid obesity (HCC)  History of anemia - Plan: CBC with Differential/Platelet She has been set up a DEXA scan. She will continue on her present medication regimen for the above diagnoses. Discussed her exercise regimen. Encouraged her to walk getting up to roughly 10,000 steps/20 minutes daily. Also discussed that when she reaches goals, get rid of the close that she has an reward herself with newer tighter fitting close to eventually shrink out of them as well. Discussed the fact that if she loses weight, we can start cutting back on her medicines for her blood pressure as well as her diabetes.

## 2017-06-17 ENCOUNTER — Other Ambulatory Visit: Payer: Self-pay | Admitting: Family Medicine

## 2017-06-17 DIAGNOSIS — E2839 Other primary ovarian failure: Secondary | ICD-10-CM

## 2017-06-20 ENCOUNTER — Other Ambulatory Visit: Payer: Self-pay | Admitting: Family Medicine

## 2017-06-23 ENCOUNTER — Telehealth: Payer: Self-pay

## 2017-06-23 DIAGNOSIS — E1159 Type 2 diabetes mellitus with other circulatory complications: Secondary | ICD-10-CM

## 2017-06-23 DIAGNOSIS — I152 Hypertension secondary to endocrine disorders: Secondary | ICD-10-CM

## 2017-06-23 DIAGNOSIS — I10 Essential (primary) hypertension: Principal | ICD-10-CM

## 2017-06-23 MED ORDER — LOSARTAN POTASSIUM-HCTZ 100-12.5 MG PO TABS: 1.0000 | ORAL_TABLET | Freq: Every day | ORAL | 1 refills | Status: DC

## 2017-06-23 MED ORDER — SITAGLIPTIN PHOS-METFORMIN HCL 50-1000 MG PO TABS: ORAL_TABLET | ORAL | 1 refills | Status: DC

## 2017-06-23 MED ORDER — CARVEDILOL 25 MG PO TABS
25.0000 mg | ORAL_TABLET | Freq: Two times a day (BID) | ORAL | 1 refills | Status: DC
Start: 2017-06-23 — End: 2017-09-08

## 2017-06-23 MED ORDER — PIOGLITAZONE HCL 45 MG PO TABS: 45.0000 mg | ORAL_TABLET | Freq: Every day | ORAL | 1 refills | Status: DC

## 2017-06-23 MED ORDER — AMLODIPINE BESYLATE 5 MG PO TABS: 5.0000 mg | ORAL_TABLET | Freq: Every day | ORAL | 1 refills | Status: DC

## 2017-06-23 MED ORDER — SIMVASTATIN 20 MG PO TABS: ORAL_TABLET | ORAL | 1 refills | Status: DC

## 2017-06-23 NOTE — Telephone Encounter (Signed)
Fax request from [facility] for refill of Janumet, simvastatin, cardedilol, amlodipine, actos, and losartan/hctz. Trixie Rude/rlb

## 2017-06-23 NOTE — Telephone Encounter (Signed)
Sent meds to pharmacy for 6 months since she just had cpe in July

## 2017-09-07 ENCOUNTER — Encounter: Payer: Self-pay | Admitting: Family Medicine

## 2017-09-07 ENCOUNTER — Ambulatory Visit (INDEPENDENT_AMBULATORY_CARE_PROVIDER_SITE_OTHER): Payer: Medicare Other | Admitting: Family Medicine

## 2017-09-07 VITALS — BP 136/84 | HR 65 | Wt 311.4 lb

## 2017-09-07 DIAGNOSIS — E1159 Type 2 diabetes mellitus with other circulatory complications: Secondary | ICD-10-CM

## 2017-09-07 DIAGNOSIS — I152 Hypertension secondary to endocrine disorders: Secondary | ICD-10-CM

## 2017-09-07 DIAGNOSIS — Z23 Encounter for immunization: Secondary | ICD-10-CM

## 2017-09-07 DIAGNOSIS — I10 Essential (primary) hypertension: Secondary | ICD-10-CM

## 2017-09-07 DIAGNOSIS — E119 Type 2 diabetes mellitus without complications: Secondary | ICD-10-CM | POA: Diagnosis not present

## 2017-09-07 DIAGNOSIS — E1169 Type 2 diabetes mellitus with other specified complication: Secondary | ICD-10-CM | POA: Diagnosis not present

## 2017-09-07 DIAGNOSIS — E785 Hyperlipidemia, unspecified: Secondary | ICD-10-CM | POA: Diagnosis not present

## 2017-09-07 DIAGNOSIS — Z862 Personal history of diseases of the blood and blood-forming organs and certain disorders involving the immune mechanism: Secondary | ICD-10-CM

## 2017-09-07 LAB — CBC WITH DIFFERENTIAL/PLATELET
BASOS PCT: 0.6 %
Basophils Absolute: 29 cells/uL (ref 0–200)
EOS PCT: 2.3 %
Eosinophils Absolute: 110 cells/uL (ref 15–500)
HCT: 31.4 % — ABNORMAL LOW (ref 35.0–45.0)
Hemoglobin: 10.6 g/dL — ABNORMAL LOW (ref 11.7–15.5)
Lymphs Abs: 1618 cells/uL (ref 850–3900)
MCH: 31.2 pg (ref 27.0–33.0)
MCHC: 33.8 g/dL (ref 32.0–36.0)
MCV: 92.4 fL (ref 80.0–100.0)
MONOS PCT: 5.8 %
MPV: 11.1 fL (ref 7.5–12.5)
NEUTROS PCT: 57.6 %
Neutro Abs: 2765 cells/uL (ref 1500–7800)
PLATELETS: 231 10*3/uL (ref 140–400)
RBC: 3.4 10*6/uL — AB (ref 3.80–5.10)
RDW: 14.8 % (ref 11.0–15.0)
TOTAL LYMPHOCYTE: 33.7 %
WBC mixed population: 278 cells/uL (ref 200–950)
WBC: 4.8 10*3/uL (ref 3.8–10.8)

## 2017-09-07 LAB — LIPID PANEL
CHOL/HDL RATIO: 2 (calc) (ref <5.0)
CHOLESTEROL: 185 mg/dL (ref <200)
HDL: 92 mg/dL (ref >50)
LDL CHOLESTEROL (CALC): 76 mg/dL
NON-HDL CHOLESTEROL (CALC): 93 mg/dL (ref <130)
TRIGLYCERIDES: 89 mg/dL (ref <150)

## 2017-09-07 LAB — POCT GLYCOSYLATED HEMOGLOBIN (HGB A1C): HEMOGLOBIN A1C: 6.5

## 2017-09-07 LAB — POCT UA - MICROALBUMIN
ALBUMIN/CREATININE RATIO, URINE, POC: 16.7
Creatinine, POC: 176.2 mg/dL
Microalbumin Ur, POC: 9.5 mg/L

## 2017-09-07 NOTE — Patient Instructions (Signed)
Fresh fruits and vegetables. Lean meats. Cut back on "white food". Bread, rice, pasta, potatoes sugar,

## 2017-09-07 NOTE — Progress Notes (Addendum)
  Subjective:    Patient ID: Christy Scott, female    DOB: 1951/10/26, 66 y.o.   MRN: [id number]  Christy Scott is a 66 y.o. female who presents for follow-up of Type 2 diabetes mellitus.  Patient is  checking home blood sugars.   Home blood sugar records: good  How often is blood sugars being checked: every 2 weeks  Current symptoms/problems include none  and have been . Daily foot checks: yes   Any foot concerns: none she does check her feet regularly. Last eye exam:03/03/2016 Exercise: yes , steps  7 days a week  She continues on amlodipine/Coreg and losartan/HCTZ. Continues on Actos and is also taking Janumet.  The following portions of the patient's history were reviewed and updated as appropriate: allergies, current medications, past medical history, past social history and problem list.  ROS as in subjective above.     Objective:    Physical Exam Alert and in no distress otherwise not examined.  Weight (!) 311 lb 6.4 oz (141.3 kg).  Lab Review Diabetic Labs Latest Ref Rng & Units 05/24/2017 08/09/2016 01/12/2016 05/05/2015 03/04/2014  HbA1c - 6.3 6.6 6.4 6.6 8.4%  Microalbumin mg/L - 13.2 - 22.1 -  Micro/Creat Ratio - - 8.5 - 14.6 -  Chol 125 - 200 mg/dL - 409166 - 811178 914(N211(H)  HDL >=46 mg/dL - 75 - 89 68  Calc LDL <130 mg/dL - 64 - 68 829(F121(H)  Triglycerides <150 mg/dL - 621136 - 308104 657109  Creatinine 0.50 - 0.99 mg/dL - 8.46(N1.03(H) - 6.291.04 5.280.74   BP/Weight 09/07/2017 05/24/2017 01/11/2017 08/09/2016 04/06/2016  Systolic BP - 136 140 126 140  Diastolic BP - 88 90 80 86  Wt. (Lbs) 311.4 317 322 311.8 318  BMI 55.16 56.15 57.04 55.23 55.44   Foot/eye exam completion dates Latest Ref Rng & Units 03/01/2016 05/05/2015  Eye Exam No Retinopathy No Retinopathy -  Foot Form Completion - - Done  A1c is 6.4  Christy Scott  reports that she has never smoked. She has never used smokeless tobacco. She reports that she does not drink alcohol or use drugs.     Assessment & Plan:    History of anemia -  Plan: CBC with Differential/Platelet  Hyperlipidemia associated with type 2 diabetes mellitus (HCC) - Plan: Lipid panel, HgB A1c, POCT UA - Microalbumin  Need for influenza vaccination - Plan: Flu vaccine HIGH DOSE PF (Fluzone High dose)  Hypertension associated with diabetes (HCC)  Morbid obesity due to excess calories (HCC)   1. Rx changes: none 2. Education: Reviewed 'ABCs' of diabetes management (respective goals in parentheses):  A1C (<7), blood pressure (<130/80), and cholesterol (LDL <100). 3. Compliance at present is estimated to be good. Efforts to improve compliance (if necessary) will be directed at Continuing with exercise program. Also discussed cutting back on carbohydrates. Information given and ABS.Marland Kitchen. 4. Follow up: 4 months  Her meds were renewed.  Blood work Looks good

## 2017-09-08 MED ORDER — CARVEDILOL 25 MG PO TABS: 25.0000 mg | ORAL_TABLET | Freq: Two times a day (BID) | ORAL | 3 refills | Status: DC

## 2017-09-08 MED ORDER — SITAGLIPTIN PHOS-METFORMIN HCL 50-1000 MG PO TABS: ORAL_TABLET | ORAL | 1 refills | Status: DC

## 2017-09-08 MED ORDER — SIMVASTATIN 20 MG PO TABS: ORAL_TABLET | ORAL | 3 refills | Status: DC

## 2017-09-08 MED ORDER — AMLODIPINE BESYLATE 5 MG PO TABS: 5.0000 mg | ORAL_TABLET | Freq: Every day | ORAL | 3 refills | Status: DC

## 2017-09-08 MED ORDER — LOSARTAN POTASSIUM-HCTZ 100-12.5 MG PO TABS: 1.0000 | ORAL_TABLET | Freq: Every day | ORAL | 3 refills | Status: DC

## 2017-09-08 NOTE — Addendum Note (Signed)
Addended by: Ronnald NianLALONDE, JOHN C on: 09/08/2017 08:11 AM   Modules accepted: Orders

## 2017-12-08 ENCOUNTER — Other Ambulatory Visit: Payer: Self-pay | Admitting: Family Medicine

## 2018-01-04 ENCOUNTER — Ambulatory Visit: Payer: Medicare Other | Admitting: Family Medicine

## 2018-02-22 ENCOUNTER — Other Ambulatory Visit: Payer: Self-pay | Admitting: Medical

## 2018-03-20 DIAGNOSIS — H52223 Regular astigmatism, bilateral: Secondary | ICD-10-CM | POA: Diagnosis not present

## 2018-03-20 DIAGNOSIS — E119 Type 2 diabetes mellitus without complications: Secondary | ICD-10-CM | POA: Diagnosis not present

## 2018-03-20 DIAGNOSIS — H524 Presbyopia: Secondary | ICD-10-CM | POA: Diagnosis not present

## 2018-03-20 DIAGNOSIS — H5213 Myopia, bilateral: Secondary | ICD-10-CM | POA: Diagnosis not present

## 2018-03-20 LAB — HM DIABETES EYE EXAM

## 2018-03-27 ENCOUNTER — Encounter: Payer: Self-pay | Admitting: Family Medicine

## 2018-04-11 ENCOUNTER — Other Ambulatory Visit: Payer: Self-pay | Admitting: Family Medicine

## 2018-04-11 DIAGNOSIS — E119 Type 2 diabetes mellitus without complications: Secondary | ICD-10-CM

## 2018-04-18 ENCOUNTER — Encounter: Payer: Self-pay | Admitting: Family Medicine

## 2018-04-18 ENCOUNTER — Ambulatory Visit (INDEPENDENT_AMBULATORY_CARE_PROVIDER_SITE_OTHER): Payer: Medicare Other | Admitting: Family Medicine

## 2018-04-18 ENCOUNTER — Telehealth: Payer: Self-pay

## 2018-04-18 VITALS — BP 144/82 | HR 61 | Temp 97.7°F | Ht 63.0 in | Wt 312.0 lb

## 2018-04-18 DIAGNOSIS — E1169 Type 2 diabetes mellitus with other specified complication: Secondary | ICD-10-CM

## 2018-04-18 DIAGNOSIS — E785 Hyperlipidemia, unspecified: Secondary | ICD-10-CM

## 2018-04-18 DIAGNOSIS — Z1231 Encounter for screening mammogram for malignant neoplasm of breast: Secondary | ICD-10-CM | POA: Diagnosis not present

## 2018-04-18 DIAGNOSIS — Z780 Asymptomatic menopausal state: Secondary | ICD-10-CM

## 2018-04-18 DIAGNOSIS — E119 Type 2 diabetes mellitus without complications: Secondary | ICD-10-CM

## 2018-04-18 DIAGNOSIS — I10 Essential (primary) hypertension: Secondary | ICD-10-CM

## 2018-04-18 DIAGNOSIS — Z1239 Encounter for other screening for malignant neoplasm of breast: Secondary | ICD-10-CM

## 2018-04-18 DIAGNOSIS — E1159 Type 2 diabetes mellitus with other circulatory complications: Secondary | ICD-10-CM | POA: Diagnosis not present

## 2018-04-18 DIAGNOSIS — I152 Hypertension secondary to endocrine disorders: Secondary | ICD-10-CM

## 2018-04-18 LAB — POCT GLYCOSYLATED HEMOGLOBIN (HGB A1C): Hemoglobin A1C: 6.7 % — AB (ref 4.0–5.6)

## 2018-04-18 MED ORDER — AMLODIPINE BESYLATE 10 MG PO TABS: 10.0000 mg | ORAL_TABLET | Freq: Every day | ORAL | 3 refills | Status: DC

## 2018-04-18 NOTE — Progress Notes (Signed)
  Subjective:    Patient ID: Christy Scott, female    DOB: [birth year], 67 y.o.   MRN: [id number]  Christy Scott is a 67 y.o. female who presents for follow-up of Type 2 diabetes mellitus.  Patient is checking home blood sugars.   Home blood sugar records: meter record How often is blood sugars being checked: 1-2 x a week Current symptoms/problems include none and have been unchanged. Daily foot checks: yes   Any foot concerns: no Last eye exam: may 2019 Exercise: ,6000 steps qd.  She does go to the sleep center but has yet to take part in the physical activities. She continues on Janumet as well as Actos and is having no difficulty with that.  She also continues on Norvasc/Coreg and losartan/HCTZ.  Simvastatin is causing no difficulties.  She does have history of osteopenia and her last DEXA was approximately 2 years ago.  Last mammogram was 2 years ago.  She is enjoying her retirement. The following portions of the patient's history were reviewed and updated as appropriate: allergies, current medications, past medical history, past social history and problem list.  ROS as in subjective above.     Objective:    Physical Exam Alert and in no distress otherwise not examined. A1c is 6.7  Lab Review Diabetic Labs Latest Ref Rng & Units 09/07/2017 05/24/2017 08/09/2016 01/12/2016 05/05/2015  HbA1c - 6.5 6.3 6.6 6.4 6.6  Microalbumin mg/L 9.5 - 13.2 - 22.1  Micro/Creat Ratio - 16.7 - 8.5 - 14.6  Chol <200 mg/dL 409185 - 811166 - 914178  HDL >78>50 mg/dL 92 - 75 - 89  Calc LDL mg/dL (calc) 76 - 64 - 68  Triglycerides <150 mg/dL 89 - 295136 - 621104  Creatinine 0.50 - 0.99 mg/dL - - 3.08(M1.03(H) - 5.781.04   BP/Weight 09/07/2017 05/24/2017 01/11/2017 08/09/2016 04/06/2016  Systolic BP 136 136 140 126 140  Diastolic BP 84 88 90 80 86  Wt. (Lbs) 311.4 317 322 311.8 318  BMI 55.16 56.15 57.04 55.23 55.44   Foot/eye exam completion dates Latest Ref Rng & Units 03/01/2016 05/05/2015  Eye Exam No Retinopathy No Retinopathy  -  Foot Form Completion - - Done    Christy Scott  reports that she has never smoked. She has never used smokeless tobacco. She reports that she does not drink alcohol or use drugs.     Assessment & Plan:    Type 2 diabetes mellitus without complication, unspecified whether long term insulin use (HCC) - Plan: POCT glycosylated hemoglobin (Hb A1C)  Screening for breast cancer - Plan: MM DIGITAL SCREENING BILATERAL  Asymptomatic postmenopausal estrogen deficiency - Plan: DG Bone Density  Hyperlipidemia associated with type 2 diabetes mellitus (HCC)  Hypertension associated with diabetes (HCC) - Plan: amLODipine (NORVASC) 10 MG tablet  Morbid obesity due to excess calories (HCC)   1. Rx changes: none 2. Education: Reviewed 'ABCs' of diabetes management (respective goals in parentheses):  A1C (<7), blood pressure (<130/80), and cholesterol (LDL <100). 3. Compliance at present is estimated to be fair. Efforts to improve compliance (if necessary) will be directed at increased exercise.  Encouraged her to take [facility].  We also discussed dietary modification and again mentioned cutting back on carbohydrates. 4. Follow up: 4 months Her blood pressure is not adequately controlled so I will increase her amlodipine.

## 2018-04-18 NOTE — Patient Instructions (Signed)
At least a half an hour of walking every day Look at white food and see if you can adjust

## 2018-04-18 NOTE — Telephone Encounter (Signed)
Call pt per Dr.Lalonde to advise to take two 5 mg of amlodipine until gone and pick up new script of 10 mg amlodipine. KH

## 2018-04-26 ENCOUNTER — Other Ambulatory Visit: Payer: Self-pay | Admitting: Family Medicine

## 2018-05-03 ENCOUNTER — Other Ambulatory Visit: Payer: Self-pay | Admitting: Family Medicine

## 2018-05-03 DIAGNOSIS — E2839 Other primary ovarian failure: Secondary | ICD-10-CM

## 2018-05-16 ENCOUNTER — Other Ambulatory Visit: Payer: Self-pay

## 2018-05-16 DIAGNOSIS — E119 Type 2 diabetes mellitus without complications: Secondary | ICD-10-CM

## 2018-05-16 MED ORDER — SITAGLIPTIN-METFORMIN HCL 50-1000 MG PO TABS
ORAL_TABLET | ORAL | 1 refills | Status: DC
Start: 2018-05-16 — End: 2018-05-16

## 2018-05-16 MED ORDER — SITAGLIPTIN PHOS-METFORMIN HCL 50-1000 MG PO TABS: ORAL_TABLET | ORAL | 1 refills | Status: DC

## 2018-05-16 NOTE — Telephone Encounter (Signed)
Pt called stating she needed a refill on her Janumet.

## 2018-05-31 ENCOUNTER — Encounter: Payer: Self-pay | Admitting: *Deleted

## 2018-06-22 ENCOUNTER — Ambulatory Visit
Admission: RE | Admit: 2018-06-22 | Discharge: 2018-06-22 | Disposition: A | Discharge status: Home / Self Care | Payer: Medicare Other | Source: Ambulatory Visit | Attending: Family Medicine | Admitting: Family Medicine

## 2018-06-22 DIAGNOSIS — Z1231 Encounter for screening mammogram for malignant neoplasm of breast: Secondary | ICD-10-CM | POA: Diagnosis not present

## 2018-06-22 DIAGNOSIS — Z780 Asymptomatic menopausal state: Secondary | ICD-10-CM | POA: Diagnosis not present

## 2018-07-19 ENCOUNTER — Other Ambulatory Visit: Payer: Self-pay | Admitting: Family Medicine

## 2018-07-19 DIAGNOSIS — E785 Hyperlipidemia, unspecified: Principal | ICD-10-CM

## 2018-07-19 DIAGNOSIS — E1169 Type 2 diabetes mellitus with other specified complication: Secondary | ICD-10-CM

## 2018-08-02 ENCOUNTER — Other Ambulatory Visit: Payer: Self-pay | Admitting: Family Medicine

## 2018-08-02 DIAGNOSIS — I10 Essential (primary) hypertension: Principal | ICD-10-CM

## 2018-08-02 DIAGNOSIS — E1159 Type 2 diabetes mellitus with other circulatory complications: Secondary | ICD-10-CM

## 2018-08-02 DIAGNOSIS — I152 Hypertension secondary to endocrine disorders: Secondary | ICD-10-CM

## 2018-09-08 ENCOUNTER — Telehealth: Payer: Self-pay | Admitting: Family Medicine

## 2018-09-08 DIAGNOSIS — E1159 Type 2 diabetes mellitus with other circulatory complications: Secondary | ICD-10-CM

## 2018-09-08 DIAGNOSIS — I152 Hypertension secondary to endocrine disorders: Secondary | ICD-10-CM

## 2018-09-08 DIAGNOSIS — I10 Essential (primary) hypertension: Principal | ICD-10-CM

## 2018-09-08 MED ORDER — LOSARTAN POTASSIUM-HCTZ 100-12.5 MG PO TABS: 1.0000 | ORAL_TABLET | Freq: Every day | ORAL | 1 refills | Status: DC

## 2018-09-08 NOTE — Telephone Encounter (Signed)
Sent in Office manager to [facility] [place]

## 2018-09-08 NOTE — Telephone Encounter (Signed)
Pt left voice message stating that Losartan script can be sent to [facility] at [facility]

## 2018-09-20 ENCOUNTER — Other Ambulatory Visit: Payer: Self-pay | Admitting: Family Medicine

## 2018-09-20 DIAGNOSIS — E1159 Type 2 diabetes mellitus with other circulatory complications: Secondary | ICD-10-CM

## 2018-09-20 DIAGNOSIS — I10 Essential (primary) hypertension: Principal | ICD-10-CM

## 2018-09-20 DIAGNOSIS — I152 Hypertension secondary to endocrine disorders: Secondary | ICD-10-CM

## 2018-10-27 ENCOUNTER — Other Ambulatory Visit: Payer: Self-pay | Admitting: Family Medicine

## 2018-11-21 ENCOUNTER — Telehealth: Payer: Self-pay | Admitting: Family Medicine

## 2018-11-21 NOTE — Telephone Encounter (Signed)
Received requested diabetic eye exam from [facility]. Sending back for review.

## 2018-12-14 ENCOUNTER — Ambulatory Visit: Payer: Self-pay | Admitting: Family Medicine

## 2019-01-03 ENCOUNTER — Ambulatory Visit (INDEPENDENT_AMBULATORY_CARE_PROVIDER_SITE_OTHER): Payer: Medicare Other | Admitting: Family Medicine

## 2019-01-03 ENCOUNTER — Encounter: Payer: Self-pay | Admitting: Family Medicine

## 2019-01-03 VITALS — BP 140/86 | HR 63 | Temp 97.7°F | Wt 331.4 lb

## 2019-01-03 DIAGNOSIS — E119 Type 2 diabetes mellitus without complications: Secondary | ICD-10-CM

## 2019-01-03 DIAGNOSIS — E1169 Type 2 diabetes mellitus with other specified complication: Secondary | ICD-10-CM

## 2019-01-03 DIAGNOSIS — Z862 Personal history of diseases of the blood and blood-forming organs and certain disorders involving the immune mechanism: Secondary | ICD-10-CM | POA: Diagnosis not present

## 2019-01-03 DIAGNOSIS — E1159 Type 2 diabetes mellitus with other circulatory complications: Secondary | ICD-10-CM | POA: Diagnosis not present

## 2019-01-03 DIAGNOSIS — I10 Essential (primary) hypertension: Secondary | ICD-10-CM

## 2019-01-03 DIAGNOSIS — I152 Hypertension secondary to endocrine disorders: Secondary | ICD-10-CM

## 2019-01-03 DIAGNOSIS — E785 Hyperlipidemia, unspecified: Secondary | ICD-10-CM

## 2019-01-03 LAB — POCT UA - MICROALBUMIN
ALBUMIN/CREATININE RATIO, URINE, POC: 15.1
Creatinine, POC: 108.6 mg/dL
Microalbumin Ur, POC: 16.4 mg/L

## 2019-01-03 LAB — POCT GLYCOSYLATED HEMOGLOBIN (HGB A1C): Hemoglobin A1C: 6.2 % — AB (ref 4.0–5.6)

## 2019-01-03 NOTE — Progress Notes (Signed)
  Subjective:    Patient ID: Christy Scott, female    DOB: 01-02-1951, 68 y.o.   MRN: [id number]  Christy Scott is a 68 y.o. female who presents for follow-up of Type 2 diabetes mellitus.  Patient is checking home blood sugars.   Home blood sugar records: meter records How often is blood sugars being checked: Q week 106-120 fasting Current symptoms/problems include none at this time. Daily foot checks: yes   Any foot concerns: none Last eye exam: 07/2018 Exercise: walking and does steps approximately 5000 steps per day.  She admits to dietary indiscretion over the holidays.  She continues on Actos as well as Janumet.  She was also taking simvastatin and having no difficulty with any of these.  Continues on losartan as well as Coreg and amlodipine.  The following portions of the patient's history were reviewed and updated as appropriate: allergies, current medications, past medical history, past social history and problem list.  ROS as in subjective above.     Objective:    Physical Exam Alert and in no distress foot exam is normal  A1c is 6.2 Lab Review Diabetic Labs Latest Ref Rng & Units 04/18/2018 09/07/2017 05/24/2017 08/09/2016 01/12/2016  HbA1c 4.0 - 5.6 % 6.7(A) 6.5 6.3 6.6 6.4  Microalbumin mg/L - 9.5 - 13.2 -  Micro/Creat Ratio - - 16.7 - 8.5 -  Chol <200 mg/dL - 438 - 377 -  HDL >93 mg/dL - 92 - 75 -  Calc LDL mg/dL (calc) - 76 - 64 -  Triglycerides <150 mg/dL - 89 - 968 -  Creatinine 0.50 - 0.99 mg/dL - - - 8.64(G) -   BP/Weight 04/18/2018 09/07/2017 05/24/2017 01/11/2017 08/09/2016  Systolic BP 144 136 136 140 126  Diastolic BP 82 84 88 90 80  Wt. (Lbs) 312 311.4 317 322 311.8  BMI 55.27 55.16 56.15 57.04 55.23   Foot/eye exam completion dates Latest Ref Rng & Units 03/20/2018 03/01/2016  Eye Exam No Retinopathy No Retinopathy No Retinopathy  Foot Form Completion - - -    Christy Scott  reports that she has never smoked. She has never used smokeless tobacco. She reports  that she does not drink alcohol or use drugs.     Assessment & Plan:    Hypertension associated with diabetes (HCC) - Plan: CBC with Differential/Platelet, Comprehensive metabolic panel  Hyperlipidemia associated with type 2 diabetes mellitus (HCC) - Plan: Lipid panel  Morbid obesity due to excess calories (HCC) - Plan: Lipid panel, CBC with Differential/Platelet, Comprehensive metabolic panel, Amb Ref to Medical Weight Management  Type 2 diabetes mellitus without complication, unspecified whether long term insulin use (HCC) - Plan: POCT UA - Microalbumin, Lipid panel, CBC with Differential/Platelet, Comprehensive metabolic panel, POCT glycosylated hemoglobin (Hb A1C), Amb Ref to Medical Weight Management  History of anemia - Plan: CBC with Differential/Platelet   1. Rx changes: none 2. Education: Reviewed 'ABCs' of diabetes management (respective goals in parentheses):  A1C (<7), blood pressure (<130/80), and cholesterol (LDL <100). 3. Compliance at present is estimated to be fair. Efforts to improve compliance (if necessary) will be directed at increased exercise.  Discussed going up to 10,000 steps per day. 4. Follow up: 4 months Also discussed diet with her in terms of cutting back on carbohydrates.  We will also set her up to go to check out medical weight loss.  I suspect that she will check it out but decided not to do it.

## 2019-01-04 LAB — CBC WITH DIFFERENTIAL/PLATELET
BASOS ABS: 0 10*3/uL (ref 0.0–0.2)
Basos: 1 %
EOS (ABSOLUTE): 0.1 10*3/uL (ref 0.0–0.4)
Eos: 2 %
Hematocrit: 32.4 % — ABNORMAL LOW (ref 34.0–46.6)
Hemoglobin: 10.8 g/dL — ABNORMAL LOW (ref 11.1–15.9)
Immature Grans (Abs): 0 10*3/uL (ref 0.0–0.1)
Immature Granulocytes: 0 %
Lymphocytes Absolute: 1.4 10*3/uL (ref 0.7–3.1)
Lymphs: 27 %
MCH: 31.1 pg (ref 26.6–33.0)
MCHC: 33.3 g/dL (ref 31.5–35.7)
MCV: 93 fL (ref 79–97)
MONOS ABS: 0.4 10*3/uL (ref 0.1–0.9)
Monocytes: 7 %
Neutrophils Absolute: 3.3 10*3/uL (ref 1.4–7.0)
Neutrophils: 63 %
Platelets: 172 10*3/uL (ref 150–450)
RBC: 3.47 x10E6/uL — ABNORMAL LOW (ref 3.77–5.28)
RDW: 15.1 % (ref 11.7–15.4)
WBC: 5.2 10*3/uL (ref 3.4–10.8)

## 2019-01-04 LAB — COMPREHENSIVE METABOLIC PANEL
ALT: 11 IU/L (ref 0–32)
AST: 17 IU/L (ref 0–40)
Albumin/Globulin Ratio: 1.5 (ref 1.2–2.2)
Albumin: 4.4 g/dL (ref 3.8–4.8)
Alkaline Phosphatase: 85 IU/L (ref 39–117)
BUN/Creatinine Ratio: 18 (ref 12–28)
BUN: 21 mg/dL (ref 8–27)
Bilirubin Total: 0.5 mg/dL (ref 0.0–1.2)
CO2: 25 mmol/L (ref 20–29)
Calcium: 9.5 mg/dL (ref 8.7–10.3)
Chloride: 102 mmol/L (ref 96–106)
Creatinine, Ser: 1.14 mg/dL — ABNORMAL HIGH (ref 0.57–1.00)
GFR calc Af Amer: 57 mL/min/{1.73_m2} — ABNORMAL LOW (ref >59)
GFR calc non Af Amer: 50 mL/min/{1.73_m2} — ABNORMAL LOW (ref >59)
GLUCOSE: 121 mg/dL — AB (ref 65–99)
Globulin, Total: 3 g/dL (ref 1.5–4.5)
Potassium: 4.6 mmol/L (ref 3.5–5.2)
Sodium: 141 mmol/L (ref 134–144)
Total Protein: 7.4 g/dL (ref 6.0–8.5)

## 2019-01-04 LAB — LIPID PANEL
Chol/HDL Ratio: 2 ratio (ref 0.0–4.4)
Cholesterol, Total: 179 mg/dL (ref 100–199)
HDL: 88 mg/dL (ref >39)
LDL Calculated: 74 mg/dL (ref 0–99)
Triglycerides: 84 mg/dL (ref 0–149)
VLDL Cholesterol Cal: 17 mg/dL (ref 5–40)

## 2019-01-16 IMAGING — MG DIGITAL SCREENING BILATERAL MAMMOGRAM WITH TOMO AND CAD
7 series · 8 of 19 positions shown · non-contrast
Comparison: None.

ACR Breast Density Category a: The breast tissue is almost entirely
fatty.

CLINICAL DATA: Screening.

EXAM:
DIGITAL SCREENING BILATERAL MAMMOGRAM WITH TOMO AND CAD

[L MLO synth-2D]
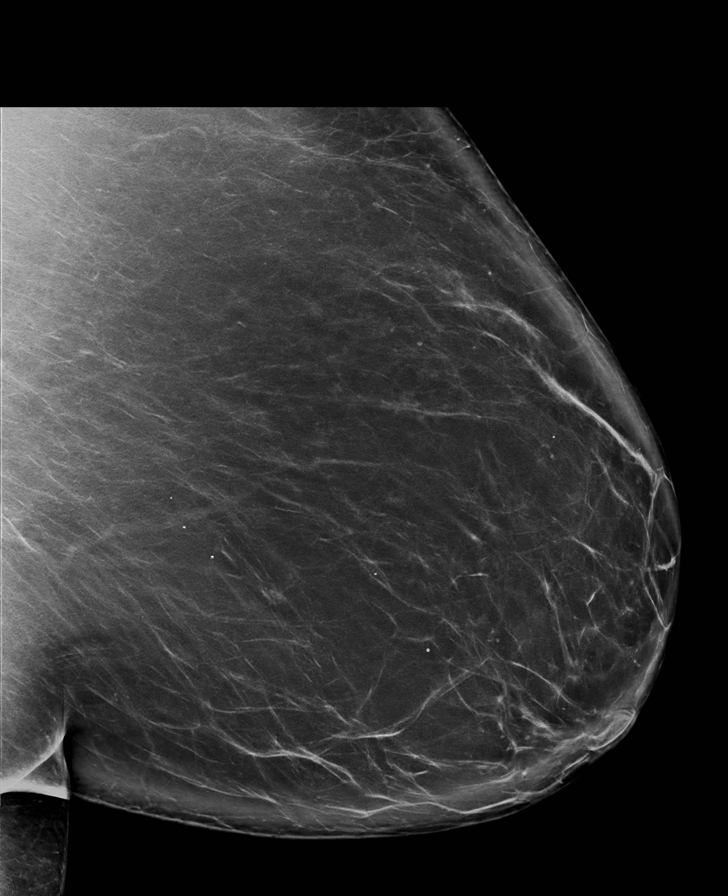

[R CC synth-2D]
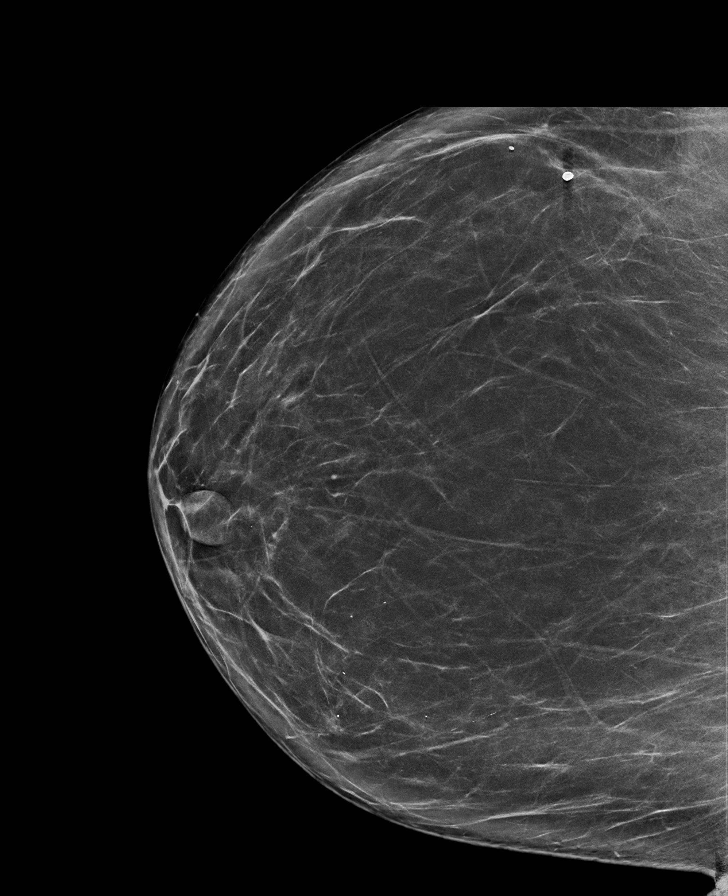

[L CC synth-2D]
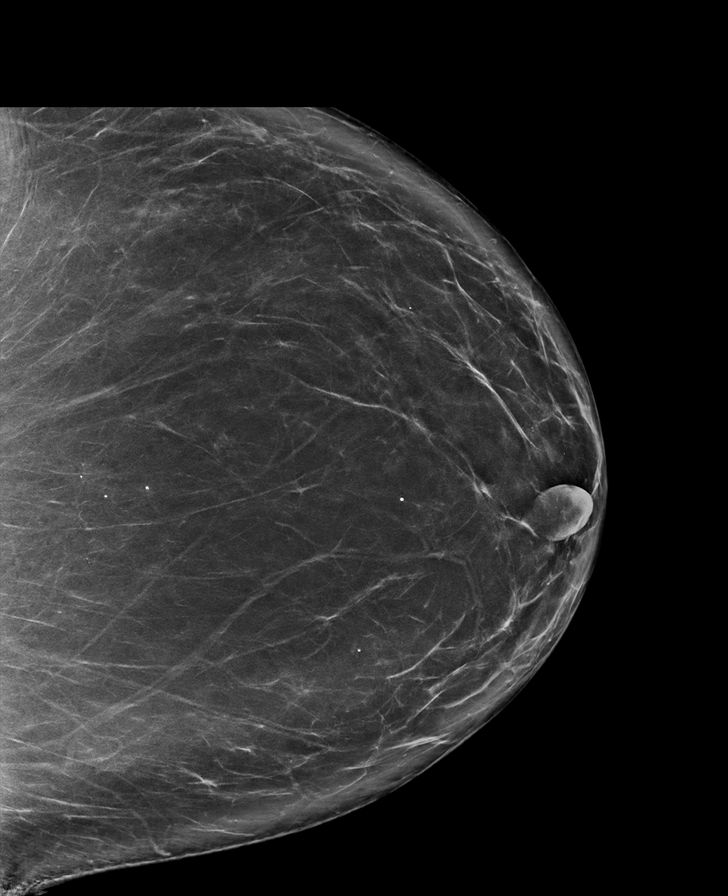

[R MLO synth-2D]
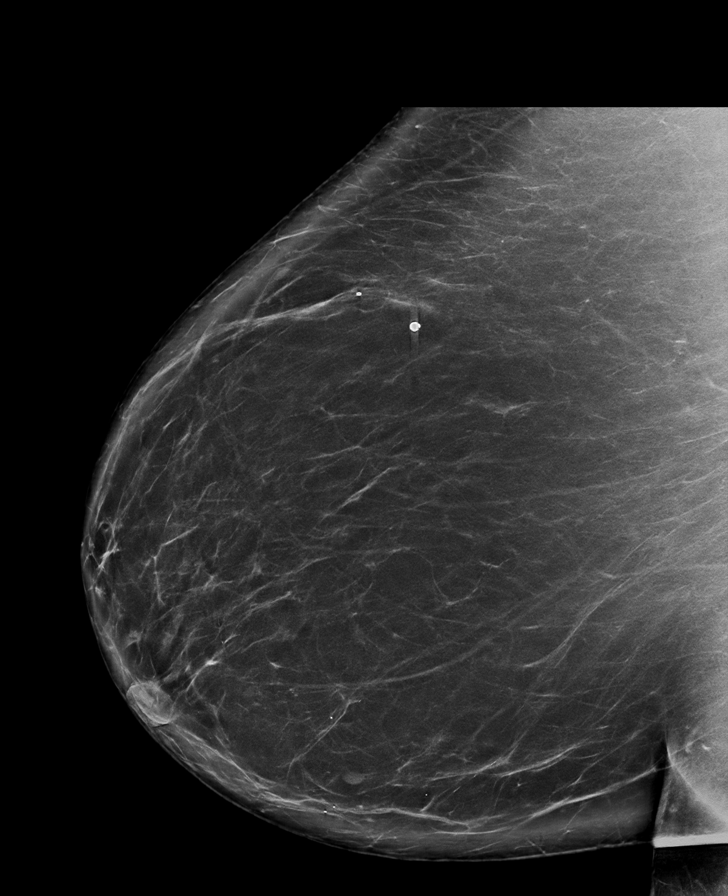

[L CC tomo · 2 of 96 frames shown]
[frame 31/96]
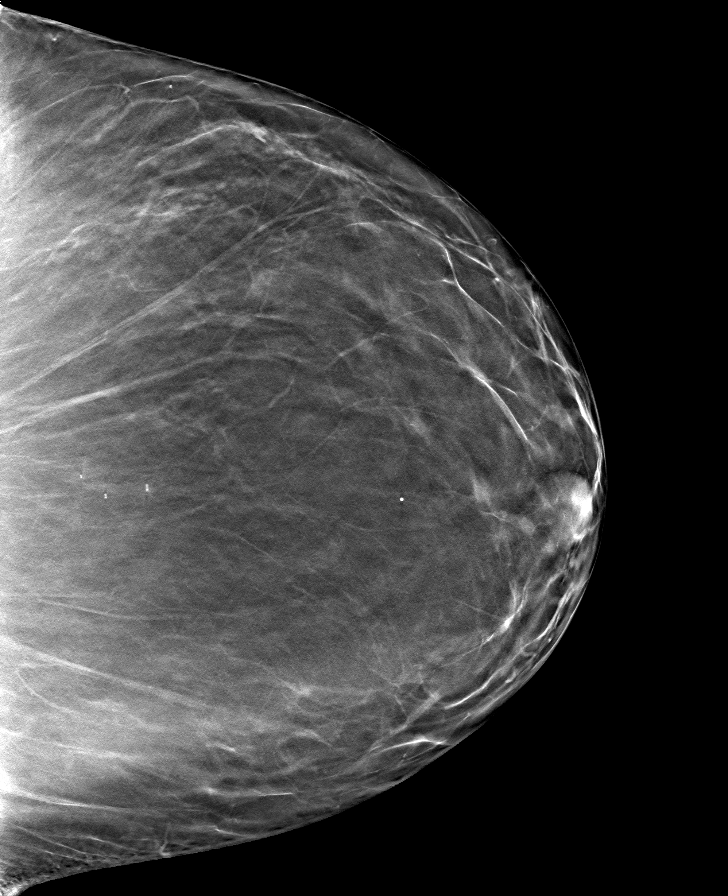
[frame 49/96]
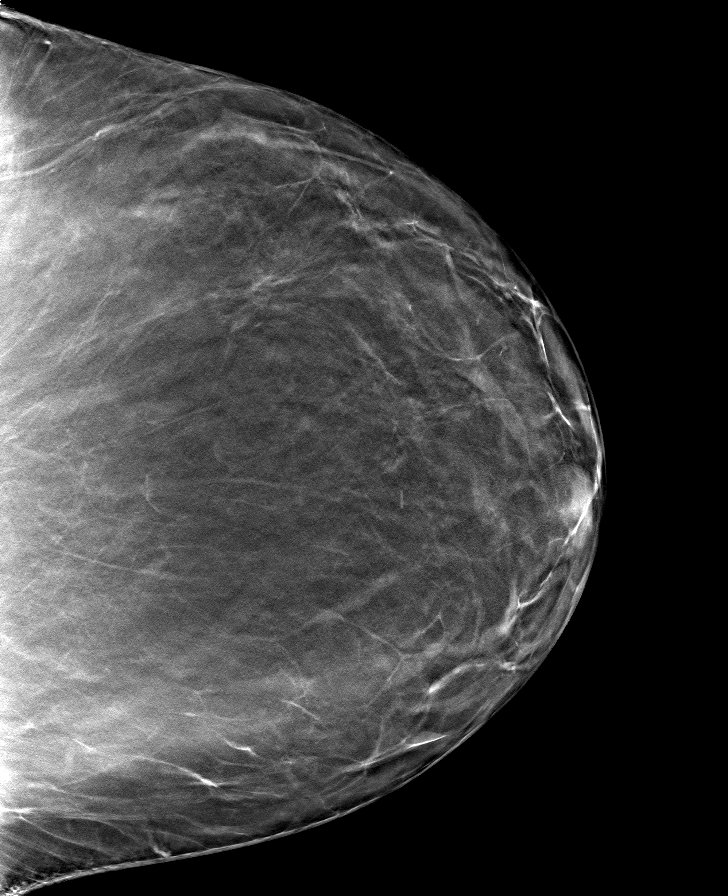

[R CC tomo · tomo slice 47/92.0]
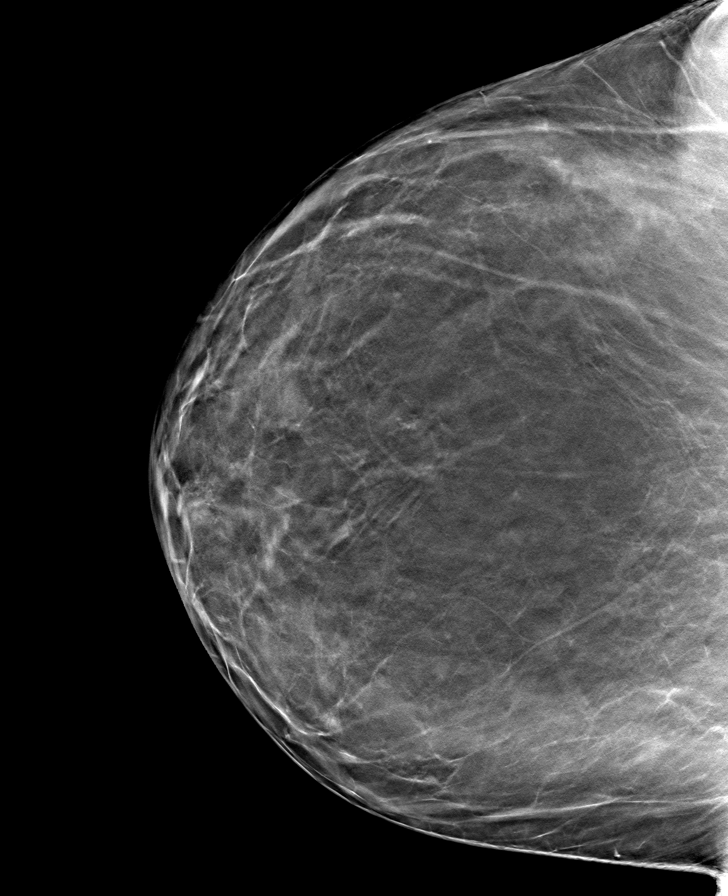

[R MLO tomo · tomo slice 55/109.0]
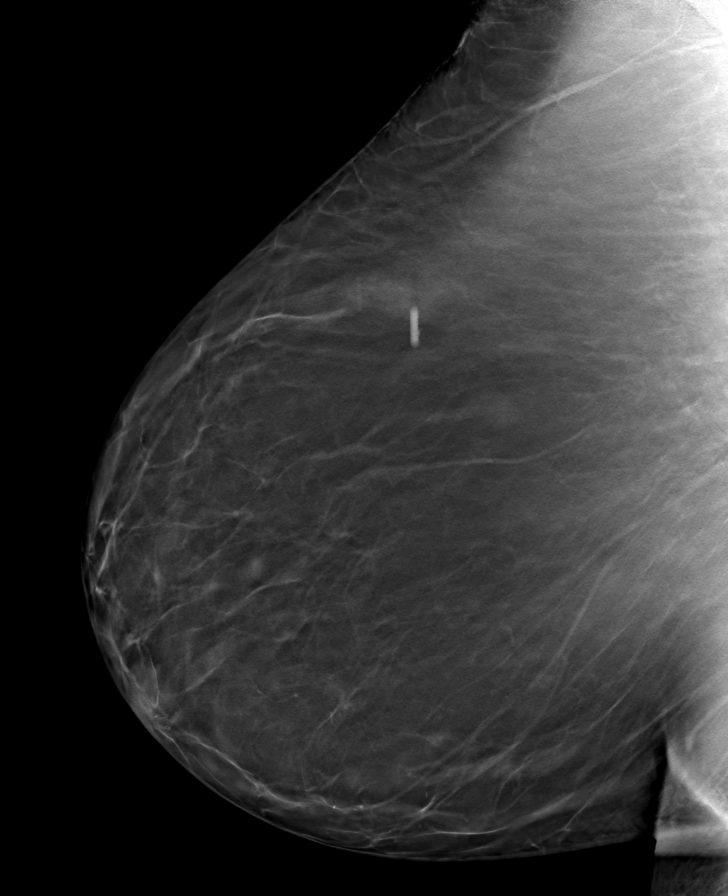

[8 of 19 positions shown; findings below may reference images not displayed]

FINDINGS: There are no findings suspicious for malignancy. Images were
processed with CAD.
IMPRESSION: No mammographic evidence of malignancy. A result letter of this
screening mammogram will be mailed directly to the patient.

RECOMMENDATION:
Screening mammogram in one year. (Code:0P-[id number])

BI-RADS CATEGORY  1: Negative.

## 2019-01-24 ENCOUNTER — Other Ambulatory Visit: Payer: Self-pay | Admitting: Family Medicine

## 2019-01-24 DIAGNOSIS — E1159 Type 2 diabetes mellitus with other circulatory complications: Secondary | ICD-10-CM

## 2019-01-24 DIAGNOSIS — I152 Hypertension secondary to endocrine disorders: Secondary | ICD-10-CM

## 2019-01-24 DIAGNOSIS — I10 Essential (primary) hypertension: Secondary | ICD-10-CM

## 2019-01-24 DIAGNOSIS — E119 Type 2 diabetes mellitus without complications: Secondary | ICD-10-CM

## 2019-03-23 ENCOUNTER — Other Ambulatory Visit: Payer: Self-pay

## 2019-03-23 NOTE — Patient Outreach (Signed)
[facility]) Care Management  03/23/2019  Zina Fernald Mar 11, 1951 [id number]   Medication Adherence call to Mrs. Jash[facility] Verify spoke with patient she is due on Janumet 50/1000 and Pioglitazone 45 mg patient explain she still has medication for about another month but when she hits the donut hole she only takes 1/2 of tablet on Janumet ,inform patient of patients assistance she does not want to apply also let patient know the doctor might have sample to check with doctor. Patient said she will check with her doctor. Mrs. Nappi is showing past due under [facility].   Lillia Abed CPhT Pharmacy Technician [facility] Care Management Direct Dial [phone]  Fax [phone] [email or web address]

## 2019-03-26 ENCOUNTER — Other Ambulatory Visit: Payer: Self-pay | Admitting: Family Medicine

## 2019-03-26 DIAGNOSIS — I152 Hypertension secondary to endocrine disorders: Secondary | ICD-10-CM

## 2019-03-26 DIAGNOSIS — E1159 Type 2 diabetes mellitus with other circulatory complications: Secondary | ICD-10-CM

## 2019-04-14 ENCOUNTER — Other Ambulatory Visit: Payer: Self-pay | Admitting: Family Medicine

## 2019-04-14 DIAGNOSIS — I152 Hypertension secondary to endocrine disorders: Secondary | ICD-10-CM

## 2019-04-14 DIAGNOSIS — E1159 Type 2 diabetes mellitus with other circulatory complications: Secondary | ICD-10-CM

## 2019-05-08 ENCOUNTER — Ambulatory Visit: Payer: Medicare Other | Admitting: Family Medicine

## 2019-07-05 ENCOUNTER — Other Ambulatory Visit: Payer: Self-pay | Admitting: Family Medicine

## 2019-07-05 DIAGNOSIS — E1169 Type 2 diabetes mellitus with other specified complication: Secondary | ICD-10-CM

## 2019-07-05 DIAGNOSIS — I152 Hypertension secondary to endocrine disorders: Secondary | ICD-10-CM

## 2019-07-05 DIAGNOSIS — E1159 Type 2 diabetes mellitus with other circulatory complications: Secondary | ICD-10-CM

## 2019-07-05 DIAGNOSIS — E785 Hyperlipidemia, unspecified: Secondary | ICD-10-CM

## 2019-08-20 ENCOUNTER — Other Ambulatory Visit: Payer: Self-pay | Admitting: Family Medicine

## 2019-08-20 DIAGNOSIS — E119 Type 2 diabetes mellitus without complications: Secondary | ICD-10-CM

## 2019-09-27 ENCOUNTER — Other Ambulatory Visit: Payer: Self-pay | Admitting: Family Medicine

## 2019-09-27 DIAGNOSIS — E1159 Type 2 diabetes mellitus with other circulatory complications: Secondary | ICD-10-CM

## 2019-09-27 DIAGNOSIS — I152 Hypertension secondary to endocrine disorders: Secondary | ICD-10-CM

## 2019-12-05 DIAGNOSIS — H52223 Regular astigmatism, bilateral: Secondary | ICD-10-CM | POA: Diagnosis not present

## 2019-12-05 DIAGNOSIS — H5213 Myopia, bilateral: Secondary | ICD-10-CM | POA: Diagnosis not present

## 2019-12-05 DIAGNOSIS — H524 Presbyopia: Secondary | ICD-10-CM | POA: Diagnosis not present

## 2019-12-05 DIAGNOSIS — E119 Type 2 diabetes mellitus without complications: Secondary | ICD-10-CM | POA: Diagnosis not present

## 2020-02-22 ENCOUNTER — Other Ambulatory Visit: Payer: Self-pay | Admitting: Family Medicine

## 2020-02-22 DIAGNOSIS — I152 Hypertension secondary to endocrine disorders: Secondary | ICD-10-CM

## 2020-02-22 DIAGNOSIS — E1159 Type 2 diabetes mellitus with other circulatory complications: Secondary | ICD-10-CM

## 2020-02-26 ENCOUNTER — Telehealth: Payer: Self-pay

## 2020-02-26 NOTE — Telephone Encounter (Signed)
alled pt to advise of the need for a med check or CPE schedule soon. KH

## 2020-05-13 ENCOUNTER — Other Ambulatory Visit: Payer: Self-pay | Admitting: Family Medicine

## 2020-05-13 DIAGNOSIS — E1169 Type 2 diabetes mellitus with other specified complication: Secondary | ICD-10-CM

## 2020-05-13 DIAGNOSIS — I152 Hypertension secondary to endocrine disorders: Secondary | ICD-10-CM

## 2020-05-13 DIAGNOSIS — E1159 Type 2 diabetes mellitus with other circulatory complications: Secondary | ICD-10-CM

## 2020-06-06 ENCOUNTER — Encounter: Payer: Self-pay | Admitting: Family Medicine

## 2020-07-06 ENCOUNTER — Other Ambulatory Visit: Payer: Self-pay | Admitting: Family Medicine

## 2020-07-06 DIAGNOSIS — I152 Hypertension secondary to endocrine disorders: Secondary | ICD-10-CM

## 2020-07-22 ENCOUNTER — Ambulatory Visit (INDEPENDENT_AMBULATORY_CARE_PROVIDER_SITE_OTHER): Payer: Medicare Other | Admitting: Family Medicine

## 2020-07-22 ENCOUNTER — Encounter: Payer: Self-pay | Admitting: Family Medicine

## 2020-07-22 ENCOUNTER — Other Ambulatory Visit: Payer: Self-pay

## 2020-07-22 VITALS — BP 152/78 | HR 65 | Temp 96.6°F | Ht 63.0 in | Wt 328.4 lb

## 2020-07-22 DIAGNOSIS — Z9119 Patient's noncompliance with other medical treatment and regimen: Secondary | ICD-10-CM | POA: Diagnosis not present

## 2020-07-22 DIAGNOSIS — Z79899 Other long term (current) drug therapy: Secondary | ICD-10-CM | POA: Diagnosis not present

## 2020-07-22 DIAGNOSIS — Z91199 Patient's noncompliance with other medical treatment and regimen due to unspecified reason: Secondary | ICD-10-CM

## 2020-07-22 DIAGNOSIS — Z1211 Encounter for screening for malignant neoplasm of colon: Secondary | ICD-10-CM

## 2020-07-22 DIAGNOSIS — Z23 Encounter for immunization: Secondary | ICD-10-CM | POA: Diagnosis not present

## 2020-07-22 DIAGNOSIS — E1159 Type 2 diabetes mellitus with other circulatory complications: Secondary | ICD-10-CM | POA: Diagnosis not present

## 2020-07-22 DIAGNOSIS — Z1159 Encounter for screening for other viral diseases: Secondary | ICD-10-CM | POA: Diagnosis not present

## 2020-07-22 DIAGNOSIS — Z862 Personal history of diseases of the blood and blood-forming organs and certain disorders involving the immune mechanism: Secondary | ICD-10-CM | POA: Diagnosis not present

## 2020-07-22 DIAGNOSIS — I152 Hypertension secondary to endocrine disorders: Secondary | ICD-10-CM

## 2020-07-22 DIAGNOSIS — I10 Essential (primary) hypertension: Secondary | ICD-10-CM

## 2020-07-22 DIAGNOSIS — E785 Hyperlipidemia, unspecified: Secondary | ICD-10-CM

## 2020-07-22 DIAGNOSIS — E119 Type 2 diabetes mellitus without complications: Secondary | ICD-10-CM | POA: Diagnosis not present

## 2020-07-22 DIAGNOSIS — E1169 Type 2 diabetes mellitus with other specified complication: Secondary | ICD-10-CM

## 2020-07-22 LAB — POCT GLYCOSYLATED HEMOGLOBIN (HGB A1C): Hemoglobin A1C: 6.8 % — AB (ref 4.0–5.6)

## 2020-07-22 LAB — POCT UA - MICROALBUMIN
Albumin/Creatinine Ratio, Urine, POC: 31.1
Creatinine, POC: 154.3 mg/dL
Microalbumin Ur, POC: 488 mg/L

## 2020-07-22 NOTE — Progress Notes (Signed)
Subjective:    Patient ID: Christy Scott, female    DOB: 01-04-1951, 69 y.o.   MRN: [id number]  Christy Scott is a 69 y.o. female who presents for follow-up of Type 2 diabetes mellitus.  Home blood sugar records: meter records, post meal, 118-158 Current symptoms/problems include none at this time. Daily foot checks:yes   Any foot concerns: none  Exercise: very little due to knee pain and back pain Diet:fair Her exercise is quite minimal.  She continues on Janumet and will need some samples to help with that.  She is taking Symbicort without difficulty.  She is also taking Actos as well as losartan/HCTZ and Coreg.  Having no difficulty with any of these medications.  She did have an eye exam done in February.  Review of the record indicates she needs a mammogram as she states she will set this up. The following portions of the patient's history were reviewed and updated as appropriate: allergies, current medications, past medical history, past social history and problem list.  ROS as in subjective above.     Objective:    Physical Exam Alert and in no distress. Tympanic membranes and canals are normal. Pharyngeal area is normal. Neck is supple without adenopathy or thyromegaly. Cardiac exam shows a regular sinus rhythm without murmurs or gallops. Lungs are clear to auscultation. Foot exam is normal and is recorded.  Hemoglobin A1c is 6.8..   Lab Review Diabetic Labs Latest Ref Rng & Units 01/03/2019 04/18/2018 09/07/2017 05/24/2017 08/09/2016  HbA1c 4.0 - 5.6 % 6.2(A) 6.7(A) 6.5 6.3 6.6  Microalbumin mg/L 16.4 - 9.5 - 13.2  Micro/Creat Ratio - 15.1 - 16.7 - 8.5  Chol 100 - 199 mg/dL 678 - 938 - 101  HDL >75 mg/dL 88 - 92 - 75  Calc LDL 0 - 99 mg/dL 74 - 76 - 64  Triglycerides 0 - 149 mg/dL 84 - 89 - 102  Creatinine 0.57 - 1.00 mg/dL 5.85(I) - - - 7.78(E)   BP/Weight 01/03/2019 04/18/2018 09/07/2017 05/24/2017 01/11/2017  Systolic BP 140 144 136 136 140  Diastolic BP 86 82 84 88 90    Wt. (Lbs) 331.4 312 311.4 317 322  BMI 58.7 55.27 55.16 56.15 57.04   Foot/eye exam completion dates Latest Ref Rng & Units 01/03/2019 03/20/2018  Eye Exam No Retinopathy - No Retinopathy  Foot Form Completion - Done -    Christy Scott  reports that she has never smoked. She has never used smokeless tobacco. She reports that she does not drink alcohol and does not use drugs.     Assessment & Plan:     Type 2 diabetes mellitus without complication, unspecified whether long term insulin use (HCC) - Plan: CBC with Differential/Platelet, Comprehensive metabolic panel, Lipid panel, POCT UA - Microalbumin, POCT glycosylated hemoglobin (Hb A1C)  Encounter for long-term (current) use of medications  Personal history of noncompliance with medical treatment, presenting hazards to health  Hypertension associated with diabetes (HCC) - Plan: CBC with Differential/Platelet, Comprehensive metabolic panel  Hyperlipidemia associated with type 2 diabetes mellitus (HCC) - Plan: Lipid panel  Morbid obesity due to excess calories (HCC) - Plan: CBC with Differential/Platelet, Comprehensive metabolic panel, Lipid panel  History of anemia - Plan: CBC with Differential/Platelet  Encounter for hepatitis C screening test for low risk patient - Plan: Hepatitis C antibody  Screening for colon cancer - Plan: Cologuard  Need for influenza vaccination - Plan: Flu Vaccine QUAD High Dose(Fluad), CANCELED: Flu vaccine HIGH DOSE PF (Fluzone High dose)  1. Rx changes: none 2. Education: Reviewed ABCs of diabetes management (respective goals in parentheses):  A1C (<7), blood pressure (<130/80), and cholesterol (LDL <100). 3. Compliance at present is estimated to be fair. Efforts to improve compliance (if necessary) will be directed at increased exercise. 4. Follow up: 4 months I again stressed the need for her to make further exercise changes.  She did make a comment about wanting a handicap sticker and explained to  her that I want her out walking rather than getting into this.  She will set up for her mammogram.

## 2020-07-22 NOTE — Patient Instructions (Signed)
20 minutes of something physical every day.  If you cannot walk for 20 minutes at one time then go to for 5 or 10 minutes

## 2020-07-23 LAB — CBC WITH DIFFERENTIAL/PLATELET
Basophils Absolute: 0 10*3/uL (ref 0.0–0.2)
Basos: 1 %
EOS (ABSOLUTE): 0.1 10*3/uL (ref 0.0–0.4)
Eos: 3 %
Hematocrit: 34.8 % (ref 34.0–46.6)
Hemoglobin: 11.3 g/dL (ref 11.1–15.9)
Immature Grans (Abs): 0 10*3/uL (ref 0.0–0.1)
Immature Granulocytes: 0 %
Lymphocytes Absolute: 1.4 10*3/uL (ref 0.7–3.1)
Lymphs: 27 %
MCH: 29.9 pg (ref 26.6–33.0)
MCHC: 32.5 g/dL (ref 31.5–35.7)
MCV: 92 fL (ref 79–97)
Monocytes Absolute: 0.4 10*3/uL (ref 0.1–0.9)
Monocytes: 8 %
Neutrophils Absolute: 3.3 10*3/uL (ref 1.4–7.0)
Neutrophils: 61 %
Platelets: 199 10*3/uL (ref 150–450)
RBC: 3.78 x10E6/uL (ref 3.77–5.28)
RDW: 15.4 % (ref 11.7–15.4)
WBC: 5.3 10*3/uL (ref 3.4–10.8)

## 2020-07-23 LAB — COMPREHENSIVE METABOLIC PANEL
ALT: 9 IU/L (ref 0–32)
AST: 18 IU/L (ref 0–40)
Albumin/Globulin Ratio: 1.4 (ref 1.2–2.2)
Albumin: 4.6 g/dL (ref 3.8–4.8)
Alkaline Phosphatase: 101 IU/L (ref 44–121)
BUN/Creatinine Ratio: 12 (ref 12–28)
BUN: 12 mg/dL (ref 8–27)
Bilirubin Total: 0.6 mg/dL (ref 0.0–1.2)
CO2: 24 mmol/L (ref 20–29)
Calcium: 9.7 mg/dL (ref 8.7–10.3)
Chloride: 103 mmol/L (ref 96–106)
Creatinine, Ser: 1.04 mg/dL — ABNORMAL HIGH (ref 0.57–1.00)
GFR calc Af Amer: 63 mL/min/{1.73_m2} (ref >59)
GFR calc non Af Amer: 55 mL/min/{1.73_m2} — ABNORMAL LOW (ref >59)
Globulin, Total: 3.4 g/dL (ref 1.5–4.5)
Glucose: 164 mg/dL — ABNORMAL HIGH (ref 65–99)
Potassium: 4.7 mmol/L (ref 3.5–5.2)
Sodium: 141 mmol/L (ref 134–144)
Total Protein: 8 g/dL (ref 6.0–8.5)

## 2020-07-23 LAB — LIPID PANEL
Chol/HDL Ratio: 2 ratio (ref 0.0–4.4)
Cholesterol, Total: 182 mg/dL (ref 100–199)
HDL: 90 mg/dL (ref >39)
LDL Chol Calc (NIH): 73 mg/dL (ref 0–99)
Triglycerides: 107 mg/dL (ref 0–149)
VLDL Cholesterol Cal: 19 mg/dL (ref 5–40)

## 2020-07-23 LAB — HEPATITIS C ANTIBODY: Hep C Virus Ab: <0.1 s/co ratio (ref 0.0–0.9)

## 2020-07-27 ENCOUNTER — Other Ambulatory Visit: Payer: Self-pay | Admitting: Family Medicine

## 2020-07-27 DIAGNOSIS — E119 Type 2 diabetes mellitus without complications: Secondary | ICD-10-CM

## 2020-08-01 ENCOUNTER — Telehealth: Payer: Self-pay

## 2020-08-01 DIAGNOSIS — Z1211 Encounter for screening for malignant neoplasm of colon: Secondary | ICD-10-CM | POA: Diagnosis not present

## 2020-08-01 LAB — COLOGUARD: Cologuard: NEGATIVE

## 2020-08-01 NOTE — Telephone Encounter (Signed)
Pt was called to advise of the need for a med well and a diabetes check. LVM due to no answer. KH

## 2020-08-06 LAB — COLOGUARD: COLOGUARD: NEGATIVE

## 2020-08-11 NOTE — Progress Notes (Signed)
LVM fr pt to call back for negative result on cologuard. KH

## 2020-10-10 ENCOUNTER — Other Ambulatory Visit: Payer: Self-pay | Admitting: Family Medicine

## 2020-10-10 DIAGNOSIS — I152 Hypertension secondary to endocrine disorders: Secondary | ICD-10-CM

## 2020-10-10 DIAGNOSIS — E1159 Type 2 diabetes mellitus with other circulatory complications: Secondary | ICD-10-CM

## 2020-12-02 ENCOUNTER — Ambulatory Visit: Payer: Medicare Other | Admitting: Family Medicine

## 2020-12-03 ENCOUNTER — Ambulatory Visit: Payer: Medicare Other | Admitting: Family Medicine

## 2021-01-30 DIAGNOSIS — Z1231 Encounter for screening mammogram for malignant neoplasm of breast: Secondary | ICD-10-CM | POA: Diagnosis not present

## 2021-01-30 LAB — HM MAMMOGRAPHY

## 2021-02-05 ENCOUNTER — Encounter: Payer: Self-pay | Admitting: Family Medicine

## 2021-02-06 ENCOUNTER — Encounter: Payer: Self-pay | Admitting: Family Medicine

## 2021-02-10 ENCOUNTER — Ambulatory Visit (INDEPENDENT_AMBULATORY_CARE_PROVIDER_SITE_OTHER): Payer: Medicare Other | Admitting: Family Medicine

## 2021-02-10 ENCOUNTER — Other Ambulatory Visit: Payer: Self-pay

## 2021-02-10 VITALS — BP 160/92 | HR 63 | Temp 96.3°F | Ht 62.5 in | Wt 325.6 lb

## 2021-02-10 DIAGNOSIS — I152 Hypertension secondary to endocrine disorders: Secondary | ICD-10-CM

## 2021-02-10 DIAGNOSIS — E785 Hyperlipidemia, unspecified: Secondary | ICD-10-CM | POA: Diagnosis not present

## 2021-02-10 DIAGNOSIS — E1169 Type 2 diabetes mellitus with other specified complication: Secondary | ICD-10-CM

## 2021-02-10 DIAGNOSIS — E1159 Type 2 diabetes mellitus with other circulatory complications: Secondary | ICD-10-CM

## 2021-02-10 DIAGNOSIS — M25562 Pain in left knee: Secondary | ICD-10-CM | POA: Diagnosis not present

## 2021-02-10 DIAGNOSIS — E119 Type 2 diabetes mellitus without complications: Secondary | ICD-10-CM | POA: Diagnosis not present

## 2021-02-10 LAB — POCT GLYCOSYLATED HEMOGLOBIN (HGB A1C): Hemoglobin A1C: 7 % — AB (ref 4.0–5.6)

## 2021-02-10 NOTE — Patient Instructions (Signed)
  Christy Scott , Thank you for taking time to come for your Medicare Wellness Visit. I appreciate your ongoing commitment to your health goals. Please review the following plan we discussed and let me know if I can assist you in the future.   These are the goals we discussed: Goals   None     This is a list of the screening recommended for you and due dates:  Health Maintenance  Topic Date Due  . Eye exam for diabetics  03/21/2019  . COVID-19 Vaccine (3 - Booster for Moderna series) 10/07/2020  . Hemoglobin A1C  01/19/2021  . Flu Shot  06/08/2021  . Complete foot exam   07/22/2021  . Mammogram  01/31/2023  . Cologuard (Stool DNA test)  08/02/2023  . Tetanus Vaccine  01/03/2029  . DEXA scan (bone density measurement)  Completed  .  Hepatitis C: One time screening is recommended by Center for Disease Control  (CDC) for  adults born from [birth year] through 1965.   Completed  . HPV Vaccine  Aged Out  . Pneumonia vaccines  Discontinued

## 2021-02-10 NOTE — Progress Notes (Signed)
Christy Scott is a 70 y.o. female who presents for annual wellness visit and follow-up on chronic medical conditions.  She has had difficulty over the last 2 months with a popping sensation in her left knee but no locking or grinding.  She has been using a cane.  She continues on losartan/HCTZ, amlodipine and Coreg for her blood pressure.  Continues on Zocor without difficulty.  She is also using Janumet and Actos for her diabetes.  Admits to being relatively sedentary with her lifestyle.  Does not smoke or drink.  Immunizations and Health Maintenance Immunization History  Administered Date(s) Administered  . Fluad Quad(high Dose 65+) 08/14/2019, 07/22/2020  . H1N1 11/18/2008  . Influenza Split 08/02/2011, 08/25/2012  . Influenza, High Dose Seasonal PF 08/09/2016, 09/07/2017, 09/01/2018  . Influenza,inj,quad, With Preservative 08/26/2015  . Influenza-Unspecified 09/01/2018  . Moderna Sars-Covid-2 Vaccination 03/10/2020, 04/07/2020, 12/10/2020  . Pneumococcal Conjugate-13 05/05/2015  . Pneumococcal Polysaccharide-23 10/02/2007  . Tdap 12/23/2008, 01/03/2019  . Zoster 08/02/2011  . Zoster Recombinat (Shingrix) 05/24/2017, 08/02/2017   Health Maintenance Due  Topic Date Due  . OPHTHALMOLOGY EXAM  03/21/2019  . HEMOGLOBIN A1C  01/19/2021    Last Pap smear: aged out Last mammogram: 01/30/21 Last colonoscopy: cologuard 07/12/20 Last DEXA: 06/22/18 Dentist: Q two years Ophtho: Q two years Exercise: walking at home and walking dog 7 days a week  Other doctors caring for patient include: Dr. Lucina Mellow eye optometrist  Advanced directives: Does Patient Have a Medical Advance Directive?: No Would patient like information on creating a medical advance directive?: Yes (ED - Information included in AVS)  Depression screen:  See questionnaire below.  Depression screen [facility] 2/9 02/10/2021 07/22/2020 05/24/2017 05/05/2015 01/22/2013  Decreased Interest 0 0 0 0 0  Down, Depressed, Hopeless 0 0 0 0 0  PHQ -  2 Score 0 0 0 0 0    Fall Risk Screen: see questionnaire below. Fall Risk  02/10/2021 07/22/2020 05/24/2017 01/12/2016 05/05/2015  Falls in the past year? 0 0 No No No  Number falls in past yr: 0 - - - -  Injury with Fall? 0 - - - -  Risk for fall due to : No Fall Risks - - Other (Comment) -  Risk for fall due to: Comment - - - walking in [place] stepped on a piece of plywood & lost balance -  Follow up Falls evaluation completed - - - -    ADL screen:  See questionnaire below Functional Status Survey: Is the patient deaf or have difficulty hearing?: No Does the patient have difficulty seeing, even when wearing glasses/contacts?: No Does the patient have difficulty concentrating, remembering, or making decisions?: No Does the patient have difficulty walking or climbing stairs?: Yes (knee popping) Does the patient have difficulty dressing or bathing?: No Does the patient have difficulty doing errands alone such as visiting a doctor's office or shopping?: No   Review of Systems Constitutional: -, -unexpected weight change, -anorexia, -fatigue Allergy: -sneezing, -itching, -congestion Dermatology: denies changing moles, rash, lumps ENT: -runny nose, -ear pain, -sore throat,  Cardiology:  -chest pain, -palpitations, -orthopnea, Respiratory: -cough, -shortness of breath, -dyspnea on exertion, -wheezing,  Gastroenterology: -abdominal pain, -nausea, -vomiting, -diarrhea, -constipation, -dysphagia Hematology: -bleeding or bruising problems Musculoskeletal: -arthralgias, -myalgias, -joint swelling, -back pain, - Ophthalmology: -vision changes,  Urology: -dysuria, -difficulty urinating,  -urinary frequency, -urgency, incontinence Neurology: -, -numbness, , -memory loss, -falls, -dizziness    PHYSICAL EXAM:   General Appearance: Alert, cooperative, no distress, appears stated age Psych:  Normal mood, affect, hygiene and grooming. Left knee exam shows slight tenderness palpation over the  medial joint line.  McMurray's testing negative.  Negative anterior drawer.  Medial and lateral collateral ligaments intact. Hemoglobin A1c 7.0 ASSESSMENT/PLAN: Type 2 diabetes mellitus without complication, unspecified whether long term insulin use (HCC) - Plan: POCT glycosylated hemoglobin (Hb A1C)  Morbid obesity (HCC)  Hypertension associated with diabetes (HCC)  Hyperlipidemia associated with type 2 diabetes mellitus (HCC)  Acute pain of left knee - Plan: Ambulatory referral to Physical Therapy Continue present medications. Discussed the fact that her blood pressure is not under good control.  She states that she will work harder on taking better care of herself.  Discussed the fact that she is having left knee pain and the best way to handle this is with physical therapy.  Explained that she probably does have some meniscal damage surgery is not a good option.  She is comfortable with that and agrees with this. Discussed  30 minutes of aerobic activity at least 5 days/week and weight-bearing exercise 2x/week; Immunization recommendations discussed.  Colonoscopy recommendations reviewed   Medicare Attestation I have personally reviewed: The patient's medical and social history Their use of alcohol, tobacco or illicit drugs Their current medications and supplements The patient's functional ability including ADLs,fall risks, home safety risks, cognitive, and hearing and visual impairment Diet and physical activities Evidence for depression or mood disorders  The patient's weight, height, and BMI have been recorded in the chart.  I have made referrals, counseling, and provided education to the patient based on review of the above and I have provided the patient with a written personalized care plan for preventive services.     Sharlot Gowda, MD   02/10/2021

## 2021-07-07 ENCOUNTER — Other Ambulatory Visit: Payer: Self-pay | Admitting: Family Medicine

## 2021-07-07 DIAGNOSIS — E785 Hyperlipidemia, unspecified: Secondary | ICD-10-CM

## 2021-07-07 DIAGNOSIS — I152 Hypertension secondary to endocrine disorders: Secondary | ICD-10-CM

## 2021-07-07 DIAGNOSIS — E1159 Type 2 diabetes mellitus with other circulatory complications: Secondary | ICD-10-CM

## 2021-07-07 DIAGNOSIS — E119 Type 2 diabetes mellitus without complications: Secondary | ICD-10-CM

## 2021-07-07 DIAGNOSIS — E1169 Type 2 diabetes mellitus with other specified complication: Secondary | ICD-10-CM

## 2021-07-30 ENCOUNTER — Telehealth: Payer: Self-pay

## 2021-07-30 NOTE — Telephone Encounter (Signed)
Please advise if you are willing to write a script for pt to have a lift chair. This will help pt in paying for it direct with out taxes. Please advise [facility]

## 2021-07-31 NOTE — Telephone Encounter (Signed)
Pt was advised and she will see [place] on 08/18/21. KH

## 2021-08-18 ENCOUNTER — Ambulatory Visit: Payer: Medicare Other | Admitting: Family Medicine

## 2021-08-26 DIAGNOSIS — H524 Presbyopia: Secondary | ICD-10-CM | POA: Diagnosis not present

## 2021-08-26 DIAGNOSIS — H5213 Myopia, bilateral: Secondary | ICD-10-CM | POA: Diagnosis not present

## 2021-08-26 DIAGNOSIS — E119 Type 2 diabetes mellitus without complications: Secondary | ICD-10-CM | POA: Diagnosis not present

## 2021-08-26 DIAGNOSIS — H52223 Regular astigmatism, bilateral: Secondary | ICD-10-CM | POA: Diagnosis not present

## 2021-08-26 LAB — HM DIABETES EYE EXAM

## 2021-09-22 ENCOUNTER — Other Ambulatory Visit: Payer: Self-pay

## 2021-09-22 ENCOUNTER — Ambulatory Visit (INDEPENDENT_AMBULATORY_CARE_PROVIDER_SITE_OTHER): Payer: Medicare Other | Admitting: Family Medicine

## 2021-09-22 ENCOUNTER — Telehealth: Payer: Self-pay | Admitting: Family Medicine

## 2021-09-22 ENCOUNTER — Encounter: Payer: Self-pay | Admitting: Family Medicine

## 2021-09-22 VITALS — BP 158/96 | HR 68 | Temp 97.0°F | Wt 326.0 lb

## 2021-09-22 DIAGNOSIS — E1169 Type 2 diabetes mellitus with other specified complication: Secondary | ICD-10-CM | POA: Diagnosis not present

## 2021-09-22 DIAGNOSIS — E119 Type 2 diabetes mellitus without complications: Secondary | ICD-10-CM

## 2021-09-22 DIAGNOSIS — E1121 Type 2 diabetes mellitus with diabetic nephropathy: Secondary | ICD-10-CM

## 2021-09-22 DIAGNOSIS — Z23 Encounter for immunization: Secondary | ICD-10-CM | POA: Diagnosis not present

## 2021-09-22 DIAGNOSIS — I152 Hypertension secondary to endocrine disorders: Secondary | ICD-10-CM | POA: Diagnosis not present

## 2021-09-22 DIAGNOSIS — E785 Hyperlipidemia, unspecified: Secondary | ICD-10-CM | POA: Diagnosis not present

## 2021-09-22 DIAGNOSIS — E1159 Type 2 diabetes mellitus with other circulatory complications: Secondary | ICD-10-CM | POA: Diagnosis not present

## 2021-09-22 LAB — COMPREHENSIVE METABOLIC PANEL
ALT: 9 IU/L (ref 0–32)
AST: 13 IU/L (ref 0–40)
Albumin/Globulin Ratio: 1.5 (ref 1.2–2.2)
Albumin: 4.5 g/dL (ref 3.8–4.8)
Alkaline Phosphatase: 97 IU/L (ref 44–121)
BUN/Creatinine Ratio: 14 (ref 12–28)
BUN: 15 mg/dL (ref 8–27)
Bilirubin Total: 0.4 mg/dL (ref 0.0–1.2)
CO2: 24 mmol/L (ref 20–29)
Calcium: 9.7 mg/dL (ref 8.7–10.3)
Chloride: 101 mmol/L (ref 96–106)
Creatinine, Ser: 1.09 mg/dL — ABNORMAL HIGH (ref 0.57–1.00)
Globulin, Total: 3.1 g/dL (ref 1.5–4.5)
Glucose: 161 mg/dL — ABNORMAL HIGH (ref 70–99)
Potassium: 4.9 mmol/L (ref 3.5–5.2)
Sodium: 140 mmol/L (ref 134–144)
Total Protein: 7.6 g/dL (ref 6.0–8.5)
eGFR: 55 mL/min/{1.73_m2} — ABNORMAL LOW (ref >59)

## 2021-09-22 LAB — CBC WITH DIFFERENTIAL/PLATELET
Basophils Absolute: 0 10*3/uL (ref 0.0–0.2)
Basos: 1 %
EOS (ABSOLUTE): 0.2 10*3/uL (ref 0.0–0.4)
Eos: 3 %
Hematocrit: 33 % — ABNORMAL LOW (ref 34.0–46.6)
Hemoglobin: 10.9 g/dL — ABNORMAL LOW (ref 11.1–15.9)
Immature Grans (Abs): 0 10*3/uL (ref 0.0–0.1)
Immature Granulocytes: 0 %
Lymphocytes Absolute: 1.5 10*3/uL (ref 0.7–3.1)
Lymphs: 25 %
MCH: 30.8 pg (ref 26.6–33.0)
MCHC: 33 g/dL (ref 31.5–35.7)
MCV: 93 fL (ref 79–97)
Monocytes Absolute: 0.4 10*3/uL (ref 0.1–0.9)
Monocytes: 6 %
Neutrophils Absolute: 3.8 10*3/uL (ref 1.4–7.0)
Neutrophils: 65 %
Platelets: 226 10*3/uL (ref 150–450)
RBC: 3.54 x10E6/uL — ABNORMAL LOW (ref 3.77–5.28)
RDW: 14.6 % (ref 11.7–15.4)
WBC: 5.9 10*3/uL (ref 3.4–10.8)

## 2021-09-22 LAB — POCT UA - MICROALBUMIN
Albumin/Creatinine Ratio, Urine, POC: 32.9
Creatinine, POC: 93.9 mg/dL
Microalbumin Ur, POC: 30.9 mg/L

## 2021-09-22 LAB — POCT GLYCOSYLATED HEMOGLOBIN (HGB A1C): Hemoglobin A1C: 6.7 % — AB (ref 4.0–5.6)

## 2021-09-22 LAB — LIPID PANEL
Chol/HDL Ratio: 2.4 ratio (ref 0.0–4.4)
Cholesterol, Total: 190 mg/dL (ref 100–199)
HDL: 80 mg/dL (ref >39)
LDL Chol Calc (NIH): 92 mg/dL (ref 0–99)
Triglycerides: 105 mg/dL (ref 0–149)
VLDL Cholesterol Cal: 18 mg/dL (ref 5–40)

## 2021-09-22 MED ORDER — VALSARTAN-HYDROCHLOROTHIAZIDE 320-25 MG PO TABS: 1.0000 | ORAL_TABLET | Freq: Every day | ORAL | 3 refills | Status: DC

## 2021-09-22 NOTE — Progress Notes (Signed)
Subjective:    Patient ID: Christy Scott, female    DOB: 1950-11-28, 70 y.o.   MRN: [id number]  Christy Scott is a 70 y.o. female who presents for follow-up of Type 2 diabetes mellitus.  Home blood sugar records:  fasting and post meal   avg 125 Current symptoms/problems include none and have been unchanged. Daily foot checks:yes  Any foot concerns: none Exercise: work out at home daily Diet: fair.  Weight is unchanged. She continues on Norvasc, Coreg and losartan/HCTZ.  She is also taking Janumet and having no difficulty with that.  Simvastatin is causing no aches or pains. The following portions of the patient's history were reviewed and updated as appropriate: allergies, current medications, past medical history, past social history and problem list.  ROS as in subjective above.     Objective:    Physical Exam Alert and in no distress otherwise not examined. Hemoglobin A1c is 6.7 A/C is now greater than 30. Lab Review Diabetic Labs Latest Ref Rng & Units 02/10/2021 07/22/2020 01/03/2019 04/18/2018 09/07/2017  HbA1c 4.0 - 5.6 % 7.0(A) 6.8(A) 6.2(A) 6.7(A) 6.5  Microalbumin mg/L - 488.0 16.4 - 9.5  Micro/Creat Ratio - - 31.1 15.1 - 16.7  Chol 100 - 199 mg/dL - 093 112 - 162  HDL >44 mg/dL - 90 88 - 92  Calc LDL 0 - 99 mg/dL - 73 74 - 76  Triglycerides 0 - 149 mg/dL - 695 84 - 89  Creatinine 0.57 - 1.00 mg/dL - 0.72(U) 5.75(Y) - -   BP/Weight 02/10/2021 07/22/2020 01/03/2019 04/18/2018 09/07/2017  Systolic BP 160 152 140 144 136  Diastolic BP 92 78 86 82 84  Wt. (Lbs) 325.6 328.4 331.4 312 311.4  BMI 58.6 58.17 58.7 55.27 55.16   Foot/eye exam completion dates Latest Ref Rng & Units 07/22/2020 01/03/2019  Eye Exam No Retinopathy - -  Foot Form Completion - Done Done    Christy Scott  reports that she has never smoked. She has never used smokeless tobacco. She reports that she does not drink alcohol and does not use drugs.     Assessment & Plan:     Type 2 diabetes mellitus  without complication, unspecified whether long term insulin use (HCC) - Plan: POCT glycosylated hemoglobin (Hb A1C), POCT UA - Microalbumin, CBC with Differential/Platelet, Comprehensive metabolic panel, Lipid panel  Morbid obesity (HCC)  Hyperlipidemia associated with type 2 diabetes mellitus (HCC) - Plan: Lipid panel  Hypertension associated with diabetes (HCC) - Plan: CBC with Differential/Platelet, Comprehensive metabolic panel  Need for influenza vaccination - Plan: CANCELED: Flu Vaccine QUAD High Dose(Fluad)  Need for COVID-19 vaccine - Plan: Moderna Covid-19 Vaccine Bivalent Booster  Diabetic nephropathy associated with type 2 diabetes mellitus (HCC)  Rx changes: I will switch her from losartan to valsartan to see if I can get better coverage for her blood pressure. Education: Reviewed 'ABCs' of diabetes management (respective goals in parentheses):  A1C (<7), blood pressure (<130/80), and cholesterol (LDL <100). Compliance at present is estimated to be fair. Efforts to improve compliance (if necessary) will be directed at dietary modifications: Again stressed the need for her to cut back on carbohydrates. . Follow up: 4 months Discussed her exercise with her and strongly encouraged her to go out for walks rather than doing it in house which is little more than stepping up and down in 1 spot. I will also refer her to [facility] to get involved in a research study to help with her diabetes and with  weight loss.

## 2021-09-22 NOTE — Telephone Encounter (Signed)
Left message for pt to call, Dr Susann Givens wants her to stop Losartan and start new bp meds when she gets them

## 2021-11-30 ENCOUNTER — Telehealth: Payer: Self-pay | Admitting: Family Medicine

## 2021-11-30 ENCOUNTER — Other Ambulatory Visit: Payer: Self-pay

## 2021-11-30 DIAGNOSIS — E1159 Type 2 diabetes mellitus with other circulatory complications: Secondary | ICD-10-CM

## 2021-11-30 MED ORDER — CARVEDILOL 25 MG PO TABS: 25.0000 mg | ORAL_TABLET | Freq: Two times a day (BID) | ORAL | 0 refills | Status: DC

## 2021-11-30 NOTE — Telephone Encounter (Signed)
Pt called and states that she is out of carvedilol and her mail order will not arrive until the 28th of January. She is requesting enough until then to be sent to a local pharmacy. Please send to [facility].

## 2021-11-30 NOTE — Telephone Encounter (Signed)
Pt was advise and it was sent in. Kh

## 2022-02-18 ENCOUNTER — Ambulatory Visit: Payer: Medicare Other | Admitting: Family Medicine

## 2022-02-26 ENCOUNTER — Ambulatory Visit (INDEPENDENT_AMBULATORY_CARE_PROVIDER_SITE_OTHER): Payer: Medicare Other

## 2022-02-26 VITALS — Ht 63.0 in | Wt 327.0 lb

## 2022-02-26 DIAGNOSIS — Z0000 Encounter for general adult medical examination without abnormal findings: Secondary | ICD-10-CM | POA: Diagnosis not present

## 2022-02-26 NOTE — Progress Notes (Signed)
?I connected with [name] today by telephone and verified that I am speaking with the correct person using two identifiers. ?Location patient: home ?Location provider: work ?Persons participating in the virtual visit: Orian Leidi, Zarra LPN. ?  ?I discussed the limitations, risks, security and privacy concerns of performing an evaluation and management service by telephone and the availability of in person appointments. I also discussed with the patient that there may be a patient responsible charge related to this service. The patient expressed understanding and verbally consented to this telephonic visit.  ?  ?Interactive audio and video telecommunications were attempted between this provider and patient, however failed, due to patient having technical difficulties OR patient did not have access to video capability.  We continued and completed visit with audio only. ? ?  ? ?Vital signs may be patient reported or missing. ? ?Subjective:  ? Christy Scott is a 71 y.o. female who presents for Medicare Annual (Subsequent) preventive examination. ? ?Review of Systems    ? ?Cardiac Risk Factors include: advanced age (>13men, >2 women);diabetes mellitus;dyslipidemia;hypertension;obesity (BMI >30kg/m2) ? ?   ?Objective:  ?  ?Today's Vitals  ? 02/26/22 1356  ?Weight: (!) 327 lb (148.3 kg)  ?Height: 5\' 3"  (1.6 m)  ? ?Body mass index is 57.93 kg/m?. ? ? ?  02/26/2022  ?  2:01 PM 02/10/2021  ?  9:50 AM 05/24/2017  ? 10:34 AM  ?Advanced Directives  ?Does Patient Have a Medical Advance Directive? No No No  ?Would patient like information on creating a medical advance directive?  Yes (ED - Information included in AVS) Yes (MAU/Ambulatory/Procedural Areas - Information given)  ? ? ?Current Medications (verified) ?Outpatient Encounter Medications as of 02/26/2022  ?Medication Sig  ? amLODipine (NORVASC) 10 MG tablet TAKE 1 TABLET BY MOUTH  DAILY  ? carvedilol (COREG) 25 MG tablet Take 1 tablet (25 mg total) by  mouth 2 (two) times daily.  ? JANUMET 50-1000 MG tablet TAKE 1 TABLET BY MOUTH TWO  TIMES DAILY WITH MEALS  ? pioglitazone (ACTOS) 45 MG tablet TAKE 1 TABLET BY MOUTH  DAILY  ? simvastatin (ZOCOR) 20 MG tablet TAKE 1 TABLET BY MOUTH AT  BEDTIME  ? valsartan-hydrochlorothiazide (DIOVAN-HCT) 320-25 MG tablet Take 1 tablet by mouth daily.  ? ?No facility-administered encounter medications on file as of 02/26/2022.  ? ? ?Allergies (verified) ?Patient has no known allergies.  ? ?History: ?Past Medical History:  ?Diagnosis Date  ? Anemia   ? Diabetes mellitus   ? TYPEII  ? Dyslipidemia   ? Exogenous obesity   ? Hyperlipidemia   ? Hypertension   ? Menopausal symptoms   ? ?Past Surgical History:  ?Procedure Laterality Date  ? CHOLECYSTECTOMY  1978  ? ?Family History  ?Problem Relation Age of Onset  ? Diabetes Mother   ? Hypertension Mother   ? Stroke Mother   ? Hypertension Father   ? Stroke Father   ? ?Social History  ? ?Socioeconomic History  ? Marital status: Single  ?  Spouse name: Not on file  ? Number of children: Not on file  ? Years of education: Not on file  ? Highest education level: Not on file  ?Occupational History  ? Not on file  ?Tobacco Use  ? Smoking status: Never  ? Smokeless tobacco: Never  ?Vaping Use  ? Vaping Use: Never used  ?Substance and Sexual Activity  ? Alcohol use: No  ? Drug use: No  ? Sexual activity: Not Currently  ?Other  Topics Concern  ? Not on file  ?Social History Narrative  ? Not on file  ? ?Social Determinants of Health  ? ?Financial Resource Strain: Low Risk   ? Difficulty of Paying Living Expenses: Not hard at all  ?Food Insecurity: No Food Insecurity  ? Worried About Charity fundraiser in the Last Year: Never true  ? Ran Out of Food in the Last Year: Never true  ?Transportation Needs: No Transportation Needs  ? Lack of Transportation (Medical): No  ? Lack of Transportation (Non-Medical): No  ?Physical Activity: Inactive  ? Days of Exercise per Week: 0 days  ? Minutes of Exercise per  Session: 0 min  ?Stress: No Stress Concern Present  ? Feeling of Stress : Not at all  ?Social Connections: Not on file  ? ? ?Tobacco Counseling ?Counseling given: Not Answered ? ? ?Clinical Intake: ? ?Pre-visit preparation completed: Yes ? ?Pain : No/denies pain ? ?  ? ?Nutritional Status: BMI > 30  Obese ?Nutritional Risks: None ?Diabetes: Yes ? ?How often do you need to have someone help you when you read instructions, pamphlets, or other written materials from your doctor or pharmacy?: 1 - Never ?What is the last grade level you completed in school?: 12th grade ? ?Diabetic? Yes ?Nutrition Risk Assessment: ? ?Has the patient had any N/V/D within the last 2 months?  No  ?Does the patient have any non-healing wounds?  No  ?Has the patient had any unintentional weight loss or weight gain?  No  ? ?Diabetes: ? ?Is the patient diabetic?  Yes  ?If diabetic, was a CBG obtained today?  No  ?Did the patient bring in their glucometer from home?  No  ?How often do you monitor your CBG's? Once week.  ? ?Financial Strains and Diabetes Management: ? ?Are you having any financial strains with the device, your supplies or your medication? No .  ?Does the patient want to be seen by Chronic Care Management for management of their diabetes?  No  ?Would the patient like to be referred to a Nutritionist or for Diabetic Management?  No  ? ?Diabetic Exams: ? ?Diabetic Eye Exam: Completed 08/26/2021 ?Diabetic Foot Exam: Completed 09/22/2021 ? ? ?Interpreter Needed?: No ? ?Information entered by :: NAllen LPN ? ? ?Activities of Daily Living ? ?  02/26/2022  ?  2:04 PM  ?In your present state of health, do you have any difficulty performing the following activities:  ?Hearing? 0  ?Vision? 0  ?Difficulty concentrating or making decisions? 0  ?Walking or climbing stairs? 1  ?Dressing or bathing? 0  ?Doing errands, shopping? 0  ?Preparing Food and eating ? N  ?Using the Toilet? N  ?In the past six months, have you accidently leaked urine? N   ?Do you have problems with loss of bowel control? N  ?Managing your Medications? N  ?Managing your Finances? N  ?Housekeeping or managing your Housekeeping? N  ? ? ?Patient Care Team: ?Denita Lung, MD as PCP - General ? ?Indicate any recent Medical Services you may have received from other than [facility] providers in the past year (date may be approximate). ? ?   ?Assessment:  ? This is a routine wellness examination for Jenae. ? ?Hearing/Vision screen ?Vision Screening - Comments:: No regular eye exams, ? ?Dietary issues and exercise activities discussed: ?Current Exercise Habits: The patient does not participate in regular exercise at present ? ? Goals Addressed   ? ?  ?  ?  ?  ?  This Visit's Progress  ?  Patient Stated     ?  02/26/2022, wants to lose weight ?  ? ?  ? ?Depression Screen ? ?  02/26/2022  ?  2:03 PM 02/10/2021  ?  9:53 AM 07/22/2020  ? 11:01 AM 05/24/2017  ? 10:13 AM 05/05/2015  ?  9:22 AM 01/22/2013  ? 10:34 AM  ?PHQ 2/9 Scores  ?PHQ - 2 Score 0 0 0 0 0 0  ?PHQ- 9 Score 0       ?  ?Fall Risk ? ?  02/26/2022  ?  2:02 PM 02/10/2021  ?  9:52 AM 07/22/2020  ? 11:01 AM 05/24/2017  ? 10:13 AM 01/12/2016  ?  9:22 AM  ?Fall Risk   ?Falls in the past year? 0 0 0 No No  ?Number falls in past yr: 0 0     ?Injury with Fall? 0 0     ?Risk for fall due to : Medication side effect No Fall Risks   Other (Comment)  ?Risk for fall due to: Comment     walking in [place] stepped on a piece of plywood & lost balance  ?Follow up Falls evaluation completed;Education provided;Falls prevention discussed Falls evaluation completed     ? ? ?FALL RISK PREVENTION PERTAINING TO THE HOME: ? ?Any stairs in or around the home? Yes  ?If so, are there any without handrails? No  ?Home free of loose throw rugs in walkways, pet beds, electrical cords, etc? Yes  ?Adequate lighting in your home to reduce risk of falls? Yes  ? ?ASSISTIVE DEVICES UTILIZED TO PREVENT FALLS: ? ?Life alert? No  ?Use of a cane, walker or w/c? Yes  ?Grab bars in the  bathroom? Yes  ?Shower chair or bench in shower? No  ?Elevated toilet seat or a handicapped toilet? Yes  ? ?TIMED UP AND GO: ? ?Was the test performed? No .  ? ? ? ? ?Cognitive Function: ?  ?  ? ?  02/26/2022

## 2022-02-26 NOTE — Patient Instructions (Signed)
[name] , ?Thank you for taking time to come for your Medicare Wellness Visit. I appreciate your ongoing commitment to your health goals. Please review the following plan we discussed and let me know if I can assist you in the future.  ? ?Screening recommendations/referrals: ?Colonoscopy: cologuard 08/01/2020, due 08/02/2023 ?Mammogram: completed 01/30/2021, due 01/31/2023 ?Bone Density: completed 06/22/2018 ?Recommended yearly ophthalmology/optometry visit for glaucoma screening and checkup ?Recommended yearly dental visit for hygiene and checkup ? ?Vaccinations: ?Influenza vaccine: due 06/08/2022 ?Pneumococcal vaccine: completed 05/05/2015 ?Tdap vaccine: completed 01/03/2019, due 01/03/2031 ?Shingles vaccine: completed    ?Covid-19:09/22/2021, 12/10/2020, 04/07/2020, 03/10/2020 ? ?Advanced directives: Advance directive discussed with you today.  ? ?Conditions/risks identified: none ? ?Next appointment: Follow up in one year for your annual wellness visit  ? ? ?Preventive Care 5265 Years and Older, Female ?Preventive care refers to lifestyle choices and visits with your health care provider that can promote health and wellness. ?What does preventive care include? ?A yearly physical exam. This is also called an annual well check. ?Dental exams once or twice a year. ?Routine eye exams. Ask your health care provider how often you should have your eyes checked. ?Personal lifestyle choices, including: ?Daily care of your teeth and gums. ?Regular physical activity. ?Eating a healthy diet. ?Avoiding tobacco and drug use. ?Limiting alcohol use. ?Practicing safe sex. ?Taking low-dose aspirin every day. ?Taking vitamin and mineral supplements as recommended by your health care provider. ?What happens during an annual well check? ?The services and screenings done by your health care provider during your annual well check will depend on your age, overall health, lifestyle risk factors, and family history of disease. ?Counseling  ?Your health  care provider may ask you questions about your: ?Alcohol use. ?Tobacco use. ?Drug use. ?Emotional well-being. ?Home and relationship well-being. ?Sexual activity. ?Eating habits. ?History of falls. ?Memory and ability to understand (cognition). ?Work and work Astronomerenvironment. ?Reproductive health. ?Screening  ?You may have the following tests or measurements: ?Height, weight, and BMI. ?Blood pressure. ?Lipid and cholesterol levels. These may be checked every 5 years, or more frequently if you are over 71 years old. ?Skin check. ?Lung cancer screening. You may have this screening every year starting at age 71 if you have a 30-pack-year history of smoking and currently smoke or have quit within the past 15 years. ?Fecal occult blood test (FOBT) of the stool. You may have this test every year starting at age 71. ?Flexible sigmoidoscopy or colonoscopy. You may have a sigmoidoscopy every 5 years or a colonoscopy every 10 years starting at age 71. ?Hepatitis C blood test. ?Hepatitis B blood test. ?Sexually transmitted disease (STD) testing. ?Diabetes screening. This is done by checking your blood sugar (glucose) after you have not eaten for a while (fasting). You may have this done every 1-3 years. ?Bone density scan. This is done to screen for osteoporosis. You may have this done starting at age 71. ?Mammogram. This may be done every 1-2 years. Talk to your health care provider about how often you should have regular mammograms. ?Talk with your health care provider about your test results, treatment options, and if necessary, the need for more tests. ?Vaccines  ?Your health care provider may recommend certain vaccines, such as: ?Influenza vaccine. This is recommended every year. ?Tetanus, diphtheria, and acellular pertussis (Tdap, Td) vaccine. You may need a Td booster every 10 years. ?Zoster vaccine. You may need this after age 71. ?Pneumococcal 13-valent conjugate (PCV13) vaccine. One dose is recommended after age  71. ?  Pneumococcal polysaccharide (PPSV23) vaccine. One dose is recommended after age 30. ?Talk to your health care provider about which screenings and vaccines you need and how often you need them. ?This information is not intended to replace advice given to you by your health care provider. Make sure you discuss any questions you have with your health care provider. ?Document Released: 11/21/2015 Document Revised: 07/14/2016 Document Reviewed: 08/26/2015 ?Elsevier Interactive Patient Education ? 2017 [employer] ? ?Fall Prevention in the Home ?Falls can cause injuries. They can happen to people of all ages. There are many things you can do to make your home safe and to help prevent falls. ?What can I do on the outside of my home? ?Regularly fix the edges of walkways and driveways and fix any cracks. ?Remove anything that might make you trip as you walk through a door, such as a raised step or threshold. ?Trim any bushes or trees on the path to your home. ?Use bright outdoor lighting. ?Clear any walking paths of anything that might make someone trip, such as rocks or tools. ?Regularly check to see if handrails are loose or broken. Make sure that both sides of any steps have handrails. ?Any raised decks and porches should have guardrails on the edges. ?Have any leaves, snow, or ice cleared regularly. ?Use sand or salt on walking paths during winter. ?Clean up any spills in your garage right away. This includes oil or grease spills. ?What can I do in the bathroom? ?Use night lights. ?Install grab bars by the toilet and in the tub and shower. Do not use towel bars as grab bars. ?Use non-skid mats or decals in the tub or shower. ?If you need to sit down in the shower, use a plastic, non-slip stool. ?Keep the floor dry. Clean up any water that spills on the floor as soon as it happens. ?Remove soap buildup in the tub or shower regularly. ?Attach bath mats securely with double-sided non-slip rug tape. ?Do not have throw  rugs and other things on the floor that can make you trip. ?What can I do in the bedroom? ?Use night lights. ?Make sure that you have a light by your bed that is easy to reach. ?Do not use any sheets or blankets that are too big for your bed. They should not hang down onto the floor. ?Have a firm chair that has side arms. You can use this for support while you get dressed. ?Do not have throw rugs and other things on the floor that can make you trip. ?What can I do in the kitchen? ?Clean up any spills right away. ?Avoid walking on wet floors. ?Keep items that you use a lot in easy-to-reach places. ?If you need to reach something above you, use a strong step stool that has a grab bar. ?Keep electrical cords out of the way. ?Do not use floor polish or wax that makes floors slippery. If you must use wax, use non-skid floor wax. ?Do not have throw rugs and other things on the floor that can make you trip. ?What can I do with my stairs? ?Do not leave any items on the stairs. ?Make sure that there are handrails on both sides of the stairs and use them. Fix handrails that are broken or loose. Make sure that handrails are as long as the stairways. ?Check any carpeting to make sure that it is firmly attached to the stairs. Fix any carpet that is loose or worn. ?Avoid having throw rugs at the top  or bottom of the stairs. If you do have throw rugs, attach them to the floor with carpet tape. ?Make sure that you have a light switch at the top of the stairs and the bottom of the stairs. If you do not have them, ask someone to add them for you. ?What else can I do to help prevent falls? ?Wear shoes that: ?Do not have high heels. ?Have rubber bottoms. ?Are comfortable and fit you well. ?Are closed at the toe. Do not wear sandals. ?If you use a stepladder: ?Make sure that it is fully opened. Do not climb a closed stepladder. ?Make sure that both sides of the stepladder are locked into place. ?Ask someone to hold it for you, if  possible. ?Clearly mark and make sure that you can see: ?Any grab bars or handrails. ?First and last steps. ?Where the edge of each step is. ?Use tools that help you move around (mobility aids) if they are needed

## 2022-04-22 ENCOUNTER — Telehealth: Payer: Self-pay

## 2022-04-22 NOTE — Telephone Encounter (Signed)
Called pt to schedule diabetes check or Med well and she states she is taking care of sister right now who is getting ready to have surgery on gall bladder and ovary and she will call back and schedule as soon as this is over.  I advised we have samples of Janumet for her when she comes in for appt.

## 2022-05-03 ENCOUNTER — Other Ambulatory Visit: Payer: Self-pay | Admitting: Family Medicine

## 2022-05-03 DIAGNOSIS — E1169 Type 2 diabetes mellitus with other specified complication: Secondary | ICD-10-CM

## 2022-05-03 DIAGNOSIS — E1159 Type 2 diabetes mellitus with other circulatory complications: Secondary | ICD-10-CM

## 2022-05-03 DIAGNOSIS — E119 Type 2 diabetes mellitus without complications: Secondary | ICD-10-CM

## 2022-07-14 ENCOUNTER — Encounter: Payer: Self-pay | Admitting: Internal Medicine

## 2022-07-22 ENCOUNTER — Other Ambulatory Visit: Payer: Self-pay | Admitting: Family Medicine

## 2022-07-22 DIAGNOSIS — E1159 Type 2 diabetes mellitus with other circulatory complications: Secondary | ICD-10-CM

## 2022-07-22 DIAGNOSIS — E785 Hyperlipidemia, unspecified: Secondary | ICD-10-CM

## 2022-07-29 ENCOUNTER — Encounter: Payer: Self-pay | Admitting: *Deleted

## 2022-07-29 NOTE — Progress Notes (Signed)
[facility] Quality Team Note  Name: Christy Scott Date of Birth: 09/05/1951 MRN: [id number] Date: 07/29/2022  [facility] Quality Team has reviewed this patient's chart, please see recommendations below:  [facility] Quality Other; KED: Kidney Health Evaluation Gap- Patient needs Urine Albumin Creatinine Ratio Test completed AND EGFR for gap closure.  Patient has upcoming appointment with Dr. Lalonde on 08/19/2022. 

## 2022-08-02 NOTE — Progress Notes (Signed)
Forwarded to Dr Redmond School

## 2022-08-17 ENCOUNTER — Encounter: Payer: Self-pay | Admitting: Internal Medicine

## 2022-08-19 ENCOUNTER — Encounter: Payer: Self-pay | Admitting: Family Medicine

## 2022-08-19 ENCOUNTER — Ambulatory Visit (INDEPENDENT_AMBULATORY_CARE_PROVIDER_SITE_OTHER): Payer: Medicare Other | Admitting: Family Medicine

## 2022-08-19 VITALS — BP 142/80 | HR 75 | Temp 96.6°F | Wt 333.8 lb

## 2022-08-19 DIAGNOSIS — E119 Type 2 diabetes mellitus without complications: Secondary | ICD-10-CM

## 2022-08-19 DIAGNOSIS — Z23 Encounter for immunization: Secondary | ICD-10-CM

## 2022-08-19 DIAGNOSIS — I152 Hypertension secondary to endocrine disorders: Secondary | ICD-10-CM

## 2022-08-19 DIAGNOSIS — E785 Hyperlipidemia, unspecified: Secondary | ICD-10-CM

## 2022-08-19 DIAGNOSIS — E1159 Type 2 diabetes mellitus with other circulatory complications: Secondary | ICD-10-CM

## 2022-08-19 DIAGNOSIS — E1169 Type 2 diabetes mellitus with other specified complication: Secondary | ICD-10-CM

## 2022-08-19 LAB — POCT UA - MICROALBUMIN
Albumin/Creatinine Ratio, Urine, POC: 12
Creatinine, POC: 116.2 mg/dL
Microalbumin Ur, POC: 13.9 mg/L

## 2022-08-19 LAB — POCT GLYCOSYLATED HEMOGLOBIN (HGB A1C): Hemoglobin A1C: 6.8 % — AB (ref 4.0–5.6)

## 2022-08-19 MED ORDER — SIMVASTATIN 20 MG PO TABS: 20.0000 mg | ORAL_TABLET | Freq: Every day | ORAL | 3 refills | Status: DC

## 2022-08-19 MED ORDER — VALSARTAN-HYDROCHLOROTHIAZIDE 320-25 MG PO TABS: 1.0000 | ORAL_TABLET | Freq: Every day | ORAL | 3 refills | Status: DC

## 2022-08-19 MED ORDER — CARVEDILOL 25 MG PO TABS: 25.0000 mg | ORAL_TABLET | Freq: Two times a day (BID) | ORAL | 3 refills | Status: DC

## 2022-08-19 MED ORDER — AMLODIPINE BESYLATE 10 MG PO TABS: 10.0000 mg | ORAL_TABLET | Freq: Every day | ORAL | 3 refills | Status: DC

## 2022-08-19 MED ORDER — JANUMET 50-1000 MG PO TABS: ORAL_TABLET | ORAL | 1 refills | Status: DC

## 2022-08-19 MED ORDER — PIOGLITAZONE HCL 45 MG PO TABS: 45.0000 mg | ORAL_TABLET | Freq: Every day | ORAL | 1 refills | Status: DC

## 2022-08-19 NOTE — Progress Notes (Signed)
Subjective:    Patient ID: Christy Scott, female    DOB: 1951-06-04, 71 y.o.   MRN: [id number]  Christy Scott is a 71 y.o. female who presents for follow-up of Type 2 diabetes mellitus.  Patient is checking home blood sugars.   Home blood sugar records: BGs have been labile ranging between 98 and 159 How often is blood sugars being checked: once a week Current symptoms/problems include none and have been stable. Daily foot checks: yes   Any foot concerns: none Last eye exam: 08/26/21 Exercise:  staying active at least 20 minutes a day She continues on amlodipine, Coreg and valsartan/HCTZ.  She also takes Janumet and Actos.  Continues on Zocor 20 mg.  She is having no difficulty with them.  Her main complaint today is bilateral knee pain interfering with her ability to ambulate and she would like a disability form filled out.  She does not smoke or drink. The following portions of the patient's history were reviewed and updated as appropriate: allergies, current medications, past medical history, past social history and problem list.  ROS as in subjective above.     Objective:    Physical Exam Alert and in no distress cardiac exam shows regular rhythm without murmurs or gallops.  Lungs are clear to auscultation.  Hemoglobin A1c 6.8 foot exam is recorded and normal Lab Review    Latest Ref Rng & Units 09/22/2021    1:25 PM 09/22/2021   11:43 AM 09/22/2021   11:13 AM 02/10/2021    4:38 PM 07/22/2020    1:38 PM  Diabetic Labs  HbA1c 4.0 - 5.6 %  6.7   7.0    Microalbumin mg/L 30.9     488.0   Micro/Creat Ratio  32.9     31.1   Chol 100 - 199 mg/dL   190     HDL >39 mg/dL   80     Calc LDL 0 - 99 mg/dL   92     Triglycerides 0 - 149 mg/dL   105     Creatinine 0.57 - 1.00 mg/dL   1.09         08/19/2022   10:23 AM 02/26/2022    1:56 PM 09/22/2021   10:05 AM 02/10/2021   10:01 AM 07/22/2020   11:01 AM  BP/Weight  Systolic BP 701  779 390 300  Diastolic BP 80  96 92 78  Wt.  (Lbs) 333.8 327 326 325.6 328.4  BMI 59.13 kg/m2 57.93 kg/m2 58.68 kg/m2 58.6 kg/m2 58.17 kg/m2      Latest Ref Rng & Units 09/22/2021    9:45 AM 08/26/2021   12:00 AM  Foot/eye exam completion dates  Eye Exam No Retinopathy  No Retinopathy      Foot Form Completion  Done      This result is from an external source.    Christy Scott  reports that she has never smoked. She has never used smokeless tobacco. She reports that she does not drink alcohol and does not use drugs.     Assessment & Plan:    Type 2 diabetes mellitus without complication, unspecified whether long term insulin use ([place]) - Plan: POCT glycosylated hemoglobin (Hb A1C), ABI (BACK OFFICE), CBC with Differential/Platelet, Comprehensive metabolic panel, Lipid panel, POCT UA - Microalbumin, sitaGLIPtin-metformin (JANUMET) 50-1000 MG tablet, pioglitazone (ACTOS) 45 MG tablet  Morbid obesity ([facility])  Hyperlipidemia associated with type 2 diabetes mellitus ([place]) - Plan: Lipid panel, simvastatin (ZOCOR) 20 MG tablet  Hypertension  associated with diabetes ([facility]) - Plan: CBC with Differential/Platelet, Comprehensive metabolic panel, carvedilol (COREG) 25 MG tablet, valsartan-hydrochlorothiazide (DIOVAN-HCT) 320-25 MG tablet, amLODipine (NORVASC) 10 MG tablet  Need for COVID-19 vaccine - Plan: PFIZER Comirnaty(GRAY TOP)COVID-19 Vaccine  Need for influenza vaccination - Plan: Flu Vaccine QUAD High Dose(Fluad) Recommend she get the mammogram.  She will follow-up with her ophthalmologist.  I will give her a disability placard.  Weight loss for her is not in the cards.  Recheck here in 6 months.

## 2022-08-19 NOTE — Patient Instructions (Signed)

## 2022-08-20 LAB — CBC WITH DIFFERENTIAL/PLATELET
Basophils Absolute: 0 10*3/uL (ref 0.0–0.2)
Basos: 1 %
EOS (ABSOLUTE): 0.1 10*3/uL (ref 0.0–0.4)
Eos: 3 %
Hematocrit: 34.3 % (ref 34.0–46.6)
Hemoglobin: 11.5 g/dL (ref 11.1–15.9)
Immature Grans (Abs): 0 10*3/uL (ref 0.0–0.1)
Immature Granulocytes: 0 %
Lymphocytes Absolute: 1.3 10*3/uL (ref 0.7–3.1)
Lymphs: 26 %
MCH: 31.6 pg (ref 26.6–33.0)
MCHC: 33.5 g/dL (ref 31.5–35.7)
MCV: 94 fL (ref 79–97)
Monocytes Absolute: 0.3 10*3/uL (ref 0.1–0.9)
Monocytes: 6 %
Neutrophils Absolute: 3.5 10*3/uL (ref 1.4–7.0)
Neutrophils: 64 %
Platelets: 235 10*3/uL (ref 150–450)
RBC: 3.64 x10E6/uL — ABNORMAL LOW (ref 3.77–5.28)
RDW: 15.2 % (ref 11.7–15.4)
WBC: 5.3 10*3/uL (ref 3.4–10.8)

## 2022-08-20 LAB — COMPREHENSIVE METABOLIC PANEL
ALT: 11 IU/L (ref 0–32)
AST: 18 IU/L (ref 0–40)
Albumin/Globulin Ratio: 1.4 (ref 1.2–2.2)
Albumin: 4.6 g/dL (ref 3.8–4.8)
Alkaline Phosphatase: 97 IU/L (ref 44–121)
BUN/Creatinine Ratio: 17 (ref 12–28)
BUN: 21 mg/dL (ref 8–27)
Bilirubin Total: 0.5 mg/dL (ref 0.0–1.2)
CO2: 21 mmol/L (ref 20–29)
Calcium: 9.8 mg/dL (ref 8.7–10.3)
Chloride: 101 mmol/L (ref 96–106)
Creatinine, Ser: 1.22 mg/dL — ABNORMAL HIGH (ref 0.57–1.00)
Globulin, Total: 3.3 g/dL (ref 1.5–4.5)
Glucose: 158 mg/dL — ABNORMAL HIGH (ref 70–99)
Potassium: 4.6 mmol/L (ref 3.5–5.2)
Sodium: 139 mmol/L (ref 134–144)
Total Protein: 7.9 g/dL (ref 6.0–8.5)
eGFR: 47 mL/min/{1.73_m2} — ABNORMAL LOW (ref >59)

## 2022-08-20 LAB — LIPID PANEL
Chol/HDL Ratio: 2.4 ratio (ref 0.0–4.4)
Cholesterol, Total: 208 mg/dL — ABNORMAL HIGH (ref 100–199)
HDL: 88 mg/dL (ref >39)
LDL Chol Calc (NIH): 101 mg/dL — ABNORMAL HIGH (ref 0–99)
Triglycerides: 108 mg/dL (ref 0–149)
VLDL Cholesterol Cal: 19 mg/dL (ref 5–40)

## 2022-08-30 ENCOUNTER — Encounter: Payer: Self-pay | Admitting: Internal Medicine

## 2022-10-27 ENCOUNTER — Other Ambulatory Visit: Payer: Self-pay | Admitting: Family Medicine

## 2022-10-27 DIAGNOSIS — E119 Type 2 diabetes mellitus without complications: Secondary | ICD-10-CM

## 2023-01-17 ENCOUNTER — Other Ambulatory Visit: Payer: Self-pay | Admitting: Family Medicine

## 2023-01-17 DIAGNOSIS — E119 Type 2 diabetes mellitus without complications: Secondary | ICD-10-CM

## 2023-02-22 ENCOUNTER — Ambulatory Visit: Payer: Medicare Other | Admitting: Family Medicine

## 2023-02-23 ENCOUNTER — Telehealth: Payer: Self-pay | Admitting: Family Medicine

## 2023-02-23 NOTE — Telephone Encounter (Signed)
Contacted Christy Scott to schedule their annual wellness visit. Appointment made for 03/08/23.  Christy Scott AW[facility] direct phone # [phone]    Due to a schedule change, we have had to reschedule your medicare wellness visit from  03/04/23  to 03/08/23    Patient is aware of appt date/time change

## 2023-03-04 ENCOUNTER — Ambulatory Visit: Payer: Medicare Other

## 2023-03-08 ENCOUNTER — Ambulatory Visit (INDEPENDENT_AMBULATORY_CARE_PROVIDER_SITE_OTHER): Payer: Medicare Other

## 2023-03-08 VITALS — Ht 63.0 in | Wt 332.0 lb

## 2023-03-08 DIAGNOSIS — Z0000 Encounter for general adult medical examination without abnormal findings: Secondary | ICD-10-CM | POA: Diagnosis not present

## 2023-03-08 NOTE — Patient Instructions (Signed)
[name] , Thank you for taking time to come for your Medicare Wellness Visit. I appreciate your ongoing commitment to your health goals. Please review the following plan we discussed and let me know if I can assist you in the future.   These are the goals we discussed:  Goals      Patient Stated     02/26/2022, wants to lose weight     Patient Stated     03/08/2023, wants to move around more        This is a list of the screening recommended for you and due dates:  Health Maintenance  Topic Date Due   Eye exam for diabetics  08/26/2022   COVID-19 Vaccine (6 - 2023-24 season) 10/14/2022   Mammogram  01/31/2023   Hemoglobin A1C  02/18/2023   Pneumonia Vaccine (3 of 3 - PPSV23 or PCV20) 03/31/2023*   Flu Shot  06/09/2023   Cologuard (Stool DNA test)  08/02/2023   Yearly kidney function blood test for diabetes  08/20/2023   Yearly kidney health urinalysis for diabetes  08/20/2023   Complete foot exam   08/20/2023   Medicare Annual Wellness Visit  03/07/2024   DTaP/Tdap/Td vaccine (3 - Td or Tdap) 01/03/2029   DEXA scan (bone density measurement)  Completed   Hepatitis C Screening: USPSTF Recommendation to screen - Ages 45-79 yo.  Completed   Zoster (Shingles) Vaccine  Completed   HPV Vaccine  Aged Out  *Topic was postponed. The date shown is not the original due date.    Advanced directives: Advance directive discussed with you today.   Conditions/risks identified: none  Next appointment: Follow up in one year for your annual wellness visit    Preventive Care 65 Years and Older, Female Preventive care refers to lifestyle choices and visits with your health care provider that can promote health and wellness. What does preventive care include? A yearly physical exam. This is also called an annual well check. Dental exams once or twice a year. Routine eye exams. Ask your health care provider how often you should have your eyes checked. Personal lifestyle choices,  including: Daily care of your teeth and gums. Regular physical activity. Eating a healthy diet. Avoiding tobacco and drug use. Limiting alcohol use. Practicing safe sex. Taking low-dose aspirin every day. Taking vitamin and mineral supplements as recommended by your health care provider. What happens during an annual well check? The services and screenings done by your health care provider during your annual well check will depend on your age, overall health, lifestyle risk factors, and family history of disease. Counseling  Your health care provider may ask you questions about your: Alcohol use. Tobacco use. Drug use. Emotional well-being. Home and relationship well-being. Sexual activity. Eating habits. History of falls. Memory and ability to understand (cognition). Work and work Astronomer. Reproductive health. Screening  You may have the following tests or measurements: Height, weight, and BMI. Blood pressure. Lipid and cholesterol levels. These may be checked every 5 years, or more frequently if you are over 82 years old. Skin check. Lung cancer screening. You may have this screening every year starting at age 75 if you have a 30-pack-year history of smoking and currently smoke or have quit within the past 15 years. Fecal occult blood test (FOBT) of the stool. You may have this test every year starting at age 70. Flexible sigmoidoscopy or colonoscopy. You may have a sigmoidoscopy every 5 years or a colonoscopy every 10 years starting at  age 52. Hepatitis C blood test. Hepatitis B blood test. Sexually transmitted disease (STD) testing. Diabetes screening. This is done by checking your blood sugar (glucose) after you have not eaten for a while (fasting). You may have this done every 1-3 years. Bone density scan. This is done to screen for osteoporosis. You may have this done starting at age 26. Mammogram. This may be done every 1-2 years. Talk to your health care provider  about how often you should have regular mammograms. Talk with your health care provider about your test results, treatment options, and if necessary, the need for more tests. Vaccines  Your health care provider may recommend certain vaccines, such as: Influenza vaccine. This is recommended every year. Tetanus, diphtheria, and acellular pertussis (Tdap, Td) vaccine. You may need a Td booster every 10 years. Zoster vaccine. You may need this after age 54. Pneumococcal 13-valent conjugate (PCV13) vaccine. One dose is recommended after age 56. Pneumococcal polysaccharide (PPSV23) vaccine. One dose is recommended after age 86. Talk to your health care provider about which screenings and vaccines you need and how often you need them. This information is not intended to replace advice given to you by your health care provider. Make sure you discuss any questions you have with your health care provider. Document Released: 11/21/2015 Document Revised: 07/14/2016 Document Reviewed: 08/26/2015 Elsevier Interactive Patient Education  2017 [place] Prevention in the Home Falls can cause injuries. They can happen to people of all ages. There are many things you can do to make your home safe and to help prevent falls. What can I do on the outside of my home? Regularly fix the edges of walkways and driveways and fix any cracks. Remove anything that might make you trip as you walk through a door, such as a raised step or threshold. Trim any bushes or trees on the path to your home. Use bright outdoor lighting. Clear any walking paths of anything that might make someone trip, such as rocks or tools. Regularly check to see if handrails are loose or broken. Make sure that both sides of any steps have handrails. Any raised decks and porches should have guardrails on the edges. Have any leaves, snow, or ice cleared regularly. Use sand or salt on walking paths during winter. Clean up any spills in  your garage right away. This includes oil or grease spills. What can I do in the bathroom? Use night lights. Install grab bars by the toilet and in the tub and shower. Do not use towel bars as grab bars. Use non-skid mats or decals in the tub or shower. If you need to sit down in the shower, use a plastic, non-slip stool. Keep the floor dry. Clean up any water that spills on the floor as soon as it happens. Remove soap buildup in the tub or shower regularly. Attach bath mats securely with double-sided non-slip rug tape. Do not have throw rugs and other things on the floor that can make you trip. What can I do in the bedroom? Use night lights. Make sure that you have a light by your bed that is easy to reach. Do not use any sheets or blankets that are too big for your bed. They should not hang down onto the floor. Have a firm chair that has side arms. You can use this for support while you get dressed. Do not have throw rugs and other things on the floor that can make you trip. What can I  do in the kitchen? Clean up any spills right away. Avoid walking on wet floors. Keep items that you use a lot in easy-to-reach places. If you need to reach something above you, use a strong step stool that has a grab bar. Keep electrical cords out of the way. Do not use floor polish or wax that makes floors slippery. If you must use wax, use non-skid floor wax. Do not have throw rugs and other things on the floor that can make you trip. What can I do with my stairs? Do not leave any items on the stairs. Make sure that there are handrails on both sides of the stairs and use them. Fix handrails that are broken or loose. Make sure that handrails are as long as the stairways. Check any carpeting to make sure that it is firmly attached to the stairs. Fix any carpet that is loose or worn. Avoid having throw rugs at the top or bottom of the stairs. If you do have throw rugs, attach them to the floor with carpet  tape. Make sure that you have a light switch at the top of the stairs and the bottom of the stairs. If you do not have them, ask someone to add them for you. What else can I do to help prevent falls? Wear shoes that: Do not have high heels. Have rubber bottoms. Are comfortable and fit you well. Are closed at the toe. Do not wear sandals. If you use a stepladder: Make sure that it is fully opened. Do not climb a closed stepladder. Make sure that both sides of the stepladder are locked into place. Ask someone to hold it for you, if possible. Clearly mark and make sure that you can see: Any grab bars or handrails. First and last steps. Where the edge of each step is. Use tools that help you move around (mobility aids) if they are needed. These include: Canes. Walkers. Scooters. Crutches. Turn on the lights when you go into a dark area. Replace any light bulbs as soon as they burn out. Set up your furniture so you have a clear path. Avoid moving your furniture around. If any of your floors are uneven, fix them. If there are any pets around you, be aware of where they are. Review your medicines with your doctor. Some medicines can make you feel dizzy. This can increase your chance of falling. Ask your doctor what other things that you can do to help prevent falls. This information is not intended to replace advice given to you by your health care provider. Make sure you discuss any questions you have with your health care provider. Document Released: 08/21/2009 Document Revised: 04/01/2016 Document Reviewed: 11/29/2014 Elsevier Interactive Patient Education  2017 [employer].

## 2023-03-08 NOTE — Progress Notes (Signed)
I connected with  Yasmene Salomone on 03/08/23 by a audio enabled telemedicine application and verified that I am speaking with the correct person using two identifiers.  Patient Location: Home  Provider Location: Office/Clinic  I discussed the limitations of evaluation and management by telemedicine. The patient expressed understanding and agreed to proceed.  Subjective:   Christy Scott is a 72 y.o. female who presents for Medicare Annual (Subsequent) preventive examination.  Review of Systems     Cardiac Risk Factors include: advanced age (>30men, >21 women);diabetes mellitus;dyslipidemia;hypertension;obesity (BMI >30kg/m2)     Objective:    Today's Vitals   03/08/23 1327  Weight: (!) 332 lb (150.6 kg)  Height: 5\' 3"  (1.6 m)   Body mass index is 58.81 kg/m.     03/08/2023    1:32 PM 02/26/2022    2:01 PM 02/10/2021    9:50 AM 05/24/2017   10:34 AM  Advanced Directives  Does Patient Have a Medical Advance Directive? No No No No  Would patient like information on creating a medical advance directive?   Yes (ED - Information included in AVS) Yes (MAU/Ambulatory/Procedural Areas - Information given)    Current Medications (verified) Outpatient Encounter Medications as of 03/08/2023  Medication Sig   amLODipine (NORVASC) 10 MG tablet Take 1 tablet (10 mg total) by mouth daily.   carvedilol (COREG) 25 MG tablet Take 1 tablet (25 mg total) by mouth 2 (two) times daily.   pioglitazone (ACTOS) 45 MG tablet TAKE 1 TABLET (45 MG TOTAL) BY  MOUTH DAILY.   simvastatin (ZOCOR) 20 MG tablet Take 1 tablet (20 mg total) by mouth at bedtime.   sitaGLIPtin-metformin (JANUMET) 50-1000 MG tablet TAKE 1 TABLET BY MOUTH  TWICE DAILY WITH MEALS   valsartan-hydrochlorothiazide (DIOVAN-HCT) 320-25 MG tablet Take 1 tablet by mouth daily.   No facility-administered encounter medications on file as of 03/08/2023.    Allergies (verified) Patient has no known allergies.   History: Past Medical  History:  Diagnosis Date   Anemia    Diabetes mellitus    TYPEII   Dyslipidemia    Exogenous obesity    Hyperlipidemia    Hypertension    Menopausal symptoms    Past Surgical History:  Procedure Laterality Date   CHOLECYSTECTOMY  1978   Family History  Problem Relation Age of Onset   Diabetes Mother    Hypertension Mother    Stroke Mother    Hypertension Father    Stroke Father    Social History   Socioeconomic History   Marital status: Single    Spouse name: Not on file   Number of children: Not on file   Years of education: Not on file   Highest education level: Not on file  Occupational History   Not on file  Tobacco Use   Smoking status: Never   Smokeless tobacco: Never  Vaping Use   Vaping Use: Never used  Substance and Sexual Activity   Alcohol use: No   Drug use: No   Sexual activity: Not Currently  Other Topics Concern   Not on file  Social History Narrative   Not on file   Social Determinants of Health   Financial Resource Strain: Low Risk  (03/08/2023)   Overall Financial Resource Strain (CARDIA)    Difficulty of Paying Living Expenses: Not hard at all  Food Insecurity: No Food Insecurity (03/08/2023)   Hunger Vital Sign    Worried About Running Out of Food in the Last Year: Never true  Ran Out of Food in the Last Year: Never true  Transportation Needs: No Transportation Needs (03/08/2023)   PRAPARE - Administrator, Civil Service (Medical): No    Lack of Transportation (Non-Medical): No  Physical Activity: Inactive (03/08/2023)   Exercise Vital Sign    Days of Exercise per Week: 0 days    Minutes of Exercise per Session: 0 min  Stress: No Stress Concern Present (03/08/2023)   [employer] of Occupational Health - Occupational Stress Questionnaire    Feeling of Stress : Not at all  Social Connections: Not on file    Tobacco Counseling Counseling given: Not Answered   Clinical Intake:  Pre-visit preparation completed:  Yes  Pain : No/denies pain     Nutritional Status: BMI > 30  Obese Nutritional Risks: None Diabetes: Yes  How often do you need to have someone help you when you read instructions, pamphlets, or other written materials from your doctor or pharmacy?: 1 - Never  Diabetic? Nutrition Risk Assessment:  Has the patient had any N/V/D within the last 2 months?  No  Does the patient have any non-healing wounds?  No  Has the patient had any unintentional weight loss or weight gain?  No   Diabetes:  Is the patient diabetic?  Yes  If diabetic, was a CBG obtained today?  No  Did the patient bring in their glucometer from home?  No  How often do you monitor your CBG's? Once weekly.   Financial Strains and Diabetes Management:  Are you having any financial strains with the device, your supplies or your medication? No .  Does the patient want to be seen by Chronic Care Management for management of their diabetes?  No  Would the patient like to be referred to a Nutritionist or for Diabetic Management?  No   Diabetic Exams:  Diabetic Eye Exam: Overdue for diabetic eye exam. Pt has been advised about the importance in completing this exam. Patient advised to call and schedule an eye exam. Diabetic Foot Exam: Completed 08/19/2022   Interpreter Needed?: No  Information entered by :: NAllen LPN   Activities of Daily Living    03/08/2023    1:35 PM  In your present state of health, do you have any difficulty performing the following activities:  Hearing? 0  Vision? 0  Difficulty concentrating or making decisions? 0  Walking or climbing stairs? 1  Dressing or bathing? 0  Doing errands, shopping? 0  Preparing Food and eating ? N  Using the Toilet? N  In the past six months, have you accidently leaked urine? N  Do you have problems with loss of bowel control? N  Managing your Medications? N  Managing your Finances? N  Housekeeping or managing your Housekeeping? N    Patient Care  Team: Ronnald Nian, MD as PCP - General  Indicate any recent Medical Services you may have received from other than [facility] providers in the past year (date may be approximate).     Assessment:   This is a routine wellness examination for Christy Scott.  Hearing/Vision screen Vision Screening - Comments:: No regular eye exams, [facility]  Dietary issues and exercise activities discussed: Current Exercise Habits: The patient does not participate in regular exercise at present   Goals Addressed             This Visit's Progress    Patient Stated       03/08/2023, wants to move around more  Depression Screen    03/08/2023    1:34 PM 02/26/2022    2:03 PM 02/10/2021    9:53 AM 07/22/2020   11:01 AM 05/24/2017   10:13 AM 05/05/2015    9:22 AM 01/22/2013   10:34 AM  PHQ 2/9 Scores  PHQ - 2 Score 0 0 0 0 0 0 0  PHQ- 9 Score 0 0         Fall Risk    03/08/2023    1:33 PM 02/26/2022    2:02 PM 02/10/2021    9:52 AM 07/22/2020   11:01 AM 05/24/2017   10:13 AM  Fall Risk   Falls in the past year? 0 0 0 0 No  Number falls in past yr: 0 0 0    Injury with Fall? 0 0 0    Risk for fall due to : Medication side effect Medication side effect No Fall Risks    Follow up Falls prevention discussed;Education provided;Falls evaluation completed Falls evaluation completed;Education provided;Falls prevention discussed Falls evaluation completed      FALL RISK PREVENTION PERTAINING TO THE HOME:  Any stairs in or around the home? Yes  If so, are there any without handrails? No  Home free of loose throw rugs in walkways, pet beds, electrical cords, etc? Yes  Adequate lighting in your home to reduce risk of falls? Yes   ASSISTIVE DEVICES UTILIZED TO PREVENT FALLS:  Life alert? No  Use of a cane, walker or w/c? Yes  Grab bars in the bathroom? Yes  Shower chair or bench in shower? No  Elevated toilet seat or a handicapped toilet? Yes   TIMED UP AND GO:  Was the test performed? No  .      Cognitive Function:        03/08/2023    1:37 PM 02/26/2022    2:05 PM  6CIT Screen  What Year? 0 points 0 points  What month? 0 points 0 points  What time? 0 points 0 points  Count back from 20 0 points 0 points  Months in reverse 0 points 0 points  Repeat phrase 4 points 4 points  Total Score 4 points 4 points    Immunizations Immunization History  Administered Date(s) Administered   Fluad Quad(high Dose 65+) 08/14/2019, 07/22/2020, 08/19/2022   H1N1 11/18/2008   Influenza Split 08/02/2011, 08/25/2012   Influenza, High Dose Seasonal PF 08/09/2016, 09/07/2017, 09/01/2018, 08/04/2021   Influenza,inj,quad, With Preservative 08/26/2015   Influenza-Unspecified 08/26/2015, 09/01/2018   Moderna Covid-19 Vaccine Bivalent Booster 45yrs & up 09/22/2021   Moderna Sars-Covid-2 Vaccination 03/10/2020, 04/07/2020, 12/10/2020   PFIZER Comirnaty(Gray Top)Covid-19 Tri-Sucrose Vaccine 08/19/2022   Pneumococcal Conjugate-13 05/05/2015   Pneumococcal Polysaccharide-23 10/02/2007   Tdap 12/23/2008, 01/03/2019   Zoster Recombinat (Shingrix) 05/24/2017, 08/02/2017   Zoster, Live 08/02/2011    TDAP status: Up to date  Flu Vaccine status: Up to date  Pneumococcal vaccine status: Up to date  Covid-19 vaccine status: Completed vaccines  Qualifies for Shingles Vaccine? Yes   Zostavax completed Yes   Shingrix Completed?: Yes  Screening Tests Health Maintenance  Topic Date Due   OPHTHALMOLOGY EXAM  08/26/2022   COVID-19 Vaccine (6 - 2023-24 season) 10/14/2022   MAMMOGRAM  01/31/2023   Medicare Annual Wellness (AWV)  02/27/2023   HEMOGLOBIN A1C  02/18/2023   Pneumonia Vaccine 17+ Years old (3 of 3 - PPSV23 or PCV20) 03/31/2023 (Originally 05/04/2020)   INFLUENZA VACCINE  06/09/2023   Fecal DNA (Cologuard)  08/02/2023   Diabetic  kidney evaluation - eGFR measurement  08/20/2023   Diabetic kidney evaluation - Urine ACR  08/20/2023   FOOT EXAM  08/20/2023   DTaP/Tdap/Td (3 - Td  or Tdap) 01/03/2029   DEXA SCAN  Completed   Hepatitis C Screening  Completed   Zoster Vaccines- Shingrix  Completed   HPV VACCINES  Aged Out    Health Maintenance  Health Maintenance Due  Topic Date Due   OPHTHALMOLOGY EXAM  08/26/2022   COVID-19 Vaccine (6 - 2023-24 season) 10/14/2022   MAMMOGRAM  01/31/2023   Medicare Annual Wellness (AWV)  02/27/2023   HEMOGLOBIN A1C  02/18/2023    Colorectal cancer screening: Type of screening: Cologuard. Completed 08/01/2020. Repeat every 3 years  Mammogram status: Completed 01/30/2021. Repeat every 2 years  Bone Density status: Completed 06/22/2018.   Lung Cancer Screening: (Low Dose CT Chest recommended if Age 32-80 years, 30 pack-year currently smoking OR have quit w/in 15years.) does not qualify.   Lung Cancer Screening Referral: no  Additional Screening:  Hepatitis C Screening: does qualify; Completed 07/22/2020  Vision Screening: Recommended annual ophthalmology exams for early detection of glaucoma and other disorders of the eye. Is the patient up to date with their annual eye exam?  No  Who is the provider or what is the name of the office in which the patient attends annual eye exams? [facility] If pt is not established with a provider, would they like to be referred to a provider to establish care? No .   Dental Screening: Recommended annual dental exams for proper oral hygiene  Community Resource Referral / Chronic Care Management: CRR required this visit?  No   CCM required this visit?  No      Plan:     I have personally reviewed and noted the following in the patient's chart:   Medical and social history Use of alcohol, tobacco or illicit drugs  Current medications and supplements including opioid prescriptions. Patient is not currently taking opioid prescriptions. Functional ability and status Nutritional status Physical activity Advanced directives List of other physicians Hospitalizations, surgeries,  and ER visits in previous 12 months Vitals Screenings to include cognitive, depression, and falls Referrals and appointments  In addition, I have reviewed and discussed with patient certain preventive protocols, quality metrics, and best practice recommendations. A written personalized care plan for preventive services as well as general preventive health recommendations were provided to patient.     Barb Merino, LPN   1/61/0960   Nurse Notes: none  Due to this being a virtual visit, the after visit summary with patients personalized plan was offered to patient via mail or my-chart. Patient would like to access on my-chart

## 2023-03-30 ENCOUNTER — Other Ambulatory Visit: Payer: Self-pay

## 2023-03-30 ENCOUNTER — Other Ambulatory Visit: Payer: Self-pay | Admitting: Family Medicine

## 2023-03-30 DIAGNOSIS — E119 Type 2 diabetes mellitus without complications: Secondary | ICD-10-CM

## 2023-04-13 ENCOUNTER — Other Ambulatory Visit: Payer: Self-pay | Admitting: Family Medicine

## 2023-04-13 DIAGNOSIS — E1169 Type 2 diabetes mellitus with other specified complication: Secondary | ICD-10-CM

## 2023-04-13 DIAGNOSIS — I152 Hypertension secondary to endocrine disorders: Secondary | ICD-10-CM

## 2023-05-17 ENCOUNTER — Other Ambulatory Visit: Payer: Self-pay | Admitting: Family Medicine

## 2023-05-17 DIAGNOSIS — E119 Type 2 diabetes mellitus without complications: Secondary | ICD-10-CM

## 2023-05-17 DIAGNOSIS — E1169 Type 2 diabetes mellitus with other specified complication: Secondary | ICD-10-CM

## 2023-05-17 DIAGNOSIS — E1159 Type 2 diabetes mellitus with other circulatory complications: Secondary | ICD-10-CM

## 2023-05-17 NOTE — Telephone Encounter (Signed)
Patient due for follow up. I called patient and scheduled appointment for 05/25/2023. Patient has enough medication until appointment.

## 2023-05-25 ENCOUNTER — Encounter: Payer: Self-pay | Admitting: Family Medicine

## 2023-05-25 ENCOUNTER — Ambulatory Visit (INDEPENDENT_AMBULATORY_CARE_PROVIDER_SITE_OTHER): Payer: Medicare Other | Admitting: Family Medicine

## 2023-05-25 VITALS — BP 138/84 | HR 64 | Temp 97.9°F | Resp 16 | Wt 335.4 lb

## 2023-05-25 DIAGNOSIS — E1169 Type 2 diabetes mellitus with other specified complication: Secondary | ICD-10-CM | POA: Diagnosis not present

## 2023-05-25 DIAGNOSIS — E785 Hyperlipidemia, unspecified: Secondary | ICD-10-CM | POA: Diagnosis not present

## 2023-05-25 DIAGNOSIS — E119 Type 2 diabetes mellitus without complications: Secondary | ICD-10-CM | POA: Diagnosis not present

## 2023-05-25 DIAGNOSIS — Z862 Personal history of diseases of the blood and blood-forming organs and certain disorders involving the immune mechanism: Secondary | ICD-10-CM

## 2023-05-25 DIAGNOSIS — E1121 Type 2 diabetes mellitus with diabetic nephropathy: Secondary | ICD-10-CM

## 2023-05-25 DIAGNOSIS — E1159 Type 2 diabetes mellitus with other circulatory complications: Secondary | ICD-10-CM | POA: Diagnosis not present

## 2023-05-25 DIAGNOSIS — I152 Hypertension secondary to endocrine disorders: Secondary | ICD-10-CM

## 2023-05-25 LAB — POCT GLYCOSYLATED HEMOGLOBIN (HGB A1C): Hemoglobin A1C: 7.4 % — AB (ref 4.0–5.6)

## 2023-05-25 MED ORDER — JANUMET 50-1000 MG PO TABS
ORAL_TABLET | ORAL | 0 refills | Status: DC
Start: 2023-05-25 — End: 2023-09-13

## 2023-05-25 MED ORDER — CARVEDILOL 25 MG PO TABS: 25.0000 mg | ORAL_TABLET | Freq: Two times a day (BID) | ORAL | 3 refills | Status: DC

## 2023-05-25 MED ORDER — SIMVASTATIN 20 MG PO TABS
20.0000 mg | ORAL_TABLET | Freq: Every day | ORAL | 3 refills | Status: DC
Start: 2023-05-25 — End: 2023-11-20

## 2023-05-25 MED ORDER — PIOGLITAZONE HCL 45 MG PO TABS: 45.0000 mg | ORAL_TABLET | Freq: Every day | ORAL | 0 refills | Status: DC

## 2023-05-25 MED ORDER — AMLODIPINE BESYLATE 10 MG PO TABS: 10.0000 mg | ORAL_TABLET | Freq: Every day | ORAL | 3 refills | Status: DC

## 2023-05-25 MED ORDER — VALSARTAN-HYDROCHLOROTHIAZIDE 320-25 MG PO TABS
1.0000 | ORAL_TABLET | Freq: Every day | ORAL | 3 refills | Status: DC
Start: 2023-05-25 — End: 2023-11-20

## 2023-05-25 NOTE — Patient Instructions (Signed)
Double the amount of walking that you are doing and cut back on "white food"

## 2023-05-25 NOTE — Progress Notes (Signed)
Subjective:    Patient ID: Christy Scott, female    DOB: [birth year], 72 y.o.   MRN: [id number]  Christy Scott is a 72 y.o. female who presents for follow-up of Type 2 diabetes mellitus.  Home blood sugar records:  blood sugars are not checked often Current symptoms/problems include none and have been stable. Daily foot checks: yes   Any foot concerns: no How often blood sugars checked: occasionally Exercise: The patient does not participate in regular exercise at present.  She does walk her dogs daily for 10 to 15 minutes slowly Diet: regular diet She continues on Actos, Janumet.  She is also taking amlodipine, Coreg, simvastatin as well as valsartan/HCTZ.  She has no concerns or complaints.  The following portions of the patient's history were reviewed and updated as appropriate: allergies, current medications, past medical history, past social history and problem list.  ROS as in subjective above.     Objective:    Physical Exam Alert and in no distress otherwise not examined. Hemoglobin A1c is 7.4 Blood pressure 138/84, pulse 64, temperature 97.9 F (36.6 C), temperature source Oral, resp. rate 16, weight (!) 335 lb 6.4 oz (152.1 kg), SpO2 97%.  Lab Review    Latest Ref Rng & Units 05/25/2023    2:49 PM 08/19/2022    4:21 PM 08/19/2022   11:23 AM 09/22/2021    1:25 PM 09/22/2021   11:43 AM  Diabetic Labs  HbA1c 4.0 - 5.6 % 7.4  6.8    6.7   Microalbumin mg/L  13.9   30.9    Micro/Creat Ratio   12.0   32.9    Chol 100 - 199 mg/dL   725     HDL >36 mg/dL   88     Calc LDL 0 - 99 mg/dL   644     Triglycerides 0 - 149 mg/dL   034     Creatinine 7.42 - 1.00 mg/dL   5.95         6/38/7564    2:34 PM 03/08/2023    1:27 PM 08/19/2022   10:23 AM 02/26/2022    1:56 PM 09/22/2021   10:05 AM  BP/Weight  Systolic BP 138 -- 142 -- 158  Diastolic BP 84 -- 80 -- 96  Wt. (Lbs) 335.4 332 333.8 327 326  BMI 59.41 kg/m2 58.81 kg/m2 59.13 kg/m2 57.93 kg/m2 58.68 kg/m2       08/19/2022   10:15 AM 09/22/2021    9:45 AM  Foot/eye exam completion dates  Foot Form Completion Done Done    Christy Scott  reports that she has never smoked. She has never used smokeless tobacco. She reports that she does not drink alcohol and does not use drugs.     Assessment & Plan:    Type 2 diabetes mellitus without complication, unspecified whether long term insulin use (HCC) - Plan: pioglitazone (ACTOS) 45 MG tablet, sitaGLIPtin-metformin (JANUMET) 50-1000 MG tablet, POCT glycosylated hemoglobin (Hb A1C)  Morbid obesity (HCC)  Hypertension associated with diabetes (HCC) - Plan: amLODipine (NORVASC) 10 MG tablet, carvedilol (COREG) 25 MG tablet, valsartan-hydrochlorothiazide (DIOVAN-HCT) 320-25 MG tablet  Hyperlipidemia associated with type 2 diabetes mellitus (HCC) - Plan: simvastatin (ZOCOR) 20 MG tablet  History of anemia  Diabetic nephropathy associated with type 2 diabetes mellitus (HCC) She is now reached over the threshold of 7 and 7.4.  I again reiterated the need for her to make diet and exercise changes.  Recommend walking her dogs twice per day and  again discussed cutting back on carbohydrates.  Also mention the possibility of starting her on a GLP-1 but she is not interested in pursuing that mainly because of the cost.  Recheck here in 4 months.

## 2023-08-16 DIAGNOSIS — H524 Presbyopia: Secondary | ICD-10-CM | POA: Diagnosis not present

## 2023-08-16 DIAGNOSIS — E119 Type 2 diabetes mellitus without complications: Secondary | ICD-10-CM | POA: Diagnosis not present

## 2023-08-16 DIAGNOSIS — H5213 Myopia, bilateral: Secondary | ICD-10-CM | POA: Diagnosis not present

## 2023-08-16 DIAGNOSIS — H52223 Regular astigmatism, bilateral: Secondary | ICD-10-CM | POA: Diagnosis not present

## 2023-09-13 ENCOUNTER — Other Ambulatory Visit: Payer: Self-pay | Admitting: Family Medicine

## 2023-09-13 DIAGNOSIS — E119 Type 2 diabetes mellitus without complications: Secondary | ICD-10-CM

## 2023-09-28 ENCOUNTER — Ambulatory Visit: Payer: Medicare Other | Admitting: Family Medicine

## 2023-10-16 ENCOUNTER — Other Ambulatory Visit: Payer: Self-pay | Admitting: Family Medicine

## 2023-10-16 DIAGNOSIS — E119 Type 2 diabetes mellitus without complications: Secondary | ICD-10-CM

## 2023-11-22 ENCOUNTER — Ambulatory Visit: Payer: Medicare Other | Admitting: Family Medicine

## 2023-11-22 ENCOUNTER — Encounter: Payer: Self-pay | Admitting: Family Medicine

## 2023-11-22 VITALS — BP 138/80 | HR 67 | Ht 63.0 in | Wt 340.6 lb

## 2023-11-22 DIAGNOSIS — Z862 Personal history of diseases of the blood and blood-forming organs and certain disorders involving the immune mechanism: Secondary | ICD-10-CM | POA: Diagnosis not present

## 2023-11-22 DIAGNOSIS — Z23 Encounter for immunization: Secondary | ICD-10-CM | POA: Diagnosis not present

## 2023-11-22 DIAGNOSIS — I152 Hypertension secondary to endocrine disorders: Secondary | ICD-10-CM | POA: Diagnosis not present

## 2023-11-22 DIAGNOSIS — N1831 Chronic kidney disease, stage 3a: Secondary | ICD-10-CM | POA: Diagnosis not present

## 2023-11-22 DIAGNOSIS — Z1231 Encounter for screening mammogram for malignant neoplasm of breast: Secondary | ICD-10-CM

## 2023-11-22 DIAGNOSIS — E119 Type 2 diabetes mellitus without complications: Secondary | ICD-10-CM | POA: Diagnosis not present

## 2023-11-22 DIAGNOSIS — E1121 Type 2 diabetes mellitus with diabetic nephropathy: Secondary | ICD-10-CM | POA: Diagnosis not present

## 2023-11-22 DIAGNOSIS — E1169 Type 2 diabetes mellitus with other specified complication: Secondary | ICD-10-CM | POA: Diagnosis not present

## 2023-11-22 DIAGNOSIS — E785 Hyperlipidemia, unspecified: Secondary | ICD-10-CM | POA: Diagnosis not present

## 2023-11-22 DIAGNOSIS — E1159 Type 2 diabetes mellitus with other circulatory complications: Secondary | ICD-10-CM | POA: Diagnosis not present

## 2023-11-22 DIAGNOSIS — Z1211 Encounter for screening for malignant neoplasm of colon: Secondary | ICD-10-CM | POA: Diagnosis not present

## 2023-11-22 DIAGNOSIS — E0822 Diabetes mellitus due to underlying condition with diabetic chronic kidney disease: Secondary | ICD-10-CM

## 2023-11-22 LAB — POCT GLYCOSYLATED HEMOGLOBIN (HGB A1C): Hemoglobin A1C: 6.8 % — AB (ref 4.0–5.6)

## 2023-11-22 MED ORDER — JANUMET 50-1000 MG PO TABS: ORAL_TABLET | ORAL | 1 refills | Status: DC

## 2023-11-22 MED ORDER — VALSARTAN-HYDROCHLOROTHIAZIDE 320-25 MG PO TABS: 1.0000 | ORAL_TABLET | Freq: Every day | ORAL | 3 refills | Status: DC

## 2023-11-22 MED ORDER — AMLODIPINE BESYLATE 10 MG PO TABS: 10.0000 mg | ORAL_TABLET | Freq: Every day | ORAL | 3 refills | Status: AC

## 2023-11-22 MED ORDER — PIOGLITAZONE HCL 45 MG PO TABS: 45.0000 mg | ORAL_TABLET | Freq: Every day | ORAL | 1 refills | Status: DC

## 2023-11-22 MED ORDER — CARVEDILOL 25 MG PO TABS: 25.0000 mg | ORAL_TABLET | Freq: Two times a day (BID) | ORAL | 3 refills | Status: DC

## 2023-11-22 MED ORDER — SIMVASTATIN 20 MG PO TABS: 20.0000 mg | ORAL_TABLET | Freq: Every day | ORAL | 3 refills | Status: DC

## 2023-11-22 NOTE — Progress Notes (Signed)
 Subjective:    Patient ID: Christy Scott, female    DOB: 08/04/1951, 73 y.o.   MRN: [id number]  Christy Scott is a 73 y.o. female who presents for follow-up of Type 2 diabetes mellitus.  Home blood sugar records:  after Christmas 150. Current symptoms/problems include  none  and have been stable.Daily foot checks:every few days  Any foot concerns: none How often blood sugars checked: once a week. Exercise:  walking in place, 2-3 times a week. Diet:regular low carb She continues on amlodipine , Coreg  as well as simvastatin .  She is also taking Janumet  and Actos .  She has no difficulty with any of these medications.  She has seen her eye doctor in the past.  She does not smoke or drink. The following portions of the patient's history were reviewed and updated as appropriate: allergies, current medications, past medical history, past social history and problem list.  ROS as in subjective above.     Objective:    Physical Exam Alert and in no distress diabetic foot exam normal.  Hemoglobin A1c is 6.8  Blood pressure 138/80, pulse 67, height 5' 3 (1.6 m), weight (!) 340 lb 9.6 oz (154.5 kg), SpO2 98%.  Lab Review    Latest Ref Rng & Units 05/25/2023    2:49 PM 08/19/2022    4:21 PM 08/19/2022   11:23 AM 09/22/2021    1:25 PM 09/22/2021   11:43 AM  Diabetic Labs  HbA1c 4.0 - 5.6 % 7.4  6.8    6.7   Microalbumin mg/L  13.9   30.9    Micro/Creat Ratio   12.0   32.9    Chol 100 - 199 mg/dL   791     HDL >60 mg/dL   88     Calc LDL 0 - 99 mg/dL   898     Triglycerides 0 - 149 mg/dL   891     Creatinine 9.42 - 1.00 mg/dL   8.77         8/85/7974   10:38 AM 05/25/2023    2:34 PM 03/08/2023    1:27 PM 08/19/2022   10:23 AM 02/26/2022    1:56 PM  BP/Weight  Systolic BP 138 138 -- 142 --  Diastolic BP 80 84 -- 80 --  Wt. (Lbs) 340.6 335.4 332 333.8 327  BMI 60.33 kg/m2 59.41 kg/m2 58.81 kg/m2 59.13 kg/m2 57.93 kg/m2      08/19/2022   10:15 AM 09/22/2021    9:45 AM   Foot/eye exam completion dates  Foot Form Completion Done Done    Chelsie  reports that she has never smoked. She has never used smokeless tobacco. She reports that she does not drink alcohol and does not use drugs.     Assessment & Plan:    Diabetes mellitus due to underlying condition with stage 3a chronic kidney disease, without long-term current use of insulin (HCC) - Plan: POCT glycosylated hemoglobin (Hb A1C), POCT UA - Microalbumin  Diabetic nephropathy associated with type 2 diabetes mellitus (HCC)  History of anemia  Hyperlipidemia associated with type 2 diabetes mellitus (HCC)  Hypertension associated with diabetes (HCC)  Morbid obesity (HCC)  Type 2 diabetes mellitus without complication, unspecified whether long term insulin use (HCC)  Need for pneumococcal 20-valent conjugate vaccination - Plan: Pneumococcal conjugate vaccine 20-valent (Prevnar 20)   I again encouraged her to become more physically active.  Also discussed possibly switching her to a different regimen to use a GLP however she is  not interested in spending the money that are required.

## 2023-11-23 LAB — CBC WITH DIFFERENTIAL/PLATELET
Basophils Absolute: 0 10*3/uL (ref 0.0–0.2)
Basos: 1 %
EOS (ABSOLUTE): 0.1 10*3/uL (ref 0.0–0.4)
Eos: 3 %
Hematocrit: 32.5 % — ABNORMAL LOW (ref 34.0–46.6)
Hemoglobin: 10.2 g/dL — ABNORMAL LOW (ref 11.1–15.9)
Immature Grans (Abs): 0 10*3/uL (ref 0.0–0.1)
Immature Granulocytes: 0 %
Lymphocytes Absolute: 1.5 10*3/uL (ref 0.7–3.1)
Lymphs: 29 %
MCH: 30.5 pg (ref 26.6–33.0)
MCHC: 31.4 g/dL — ABNORMAL LOW (ref 31.5–35.7)
MCV: 97 fL (ref 79–97)
Monocytes Absolute: 0.4 10*3/uL (ref 0.1–0.9)
Monocytes: 7 %
Neutrophils Absolute: 3.1 10*3/uL (ref 1.4–7.0)
Neutrophils: 60 %
Platelets: 262 10*3/uL (ref 150–450)
RBC: 3.34 x10E6/uL — ABNORMAL LOW (ref 3.77–5.28)
RDW: 15 % (ref 11.7–15.4)
WBC: 5.2 10*3/uL (ref 3.4–10.8)

## 2023-11-23 LAB — LIPID PANEL
Chol/HDL Ratio: 2.2 {ratio} (ref 0.0–4.4)
Cholesterol, Total: 179 mg/dL (ref 100–199)
HDL: 83 mg/dL (ref >39)
LDL Chol Calc (NIH): 79 mg/dL (ref 0–99)
Triglycerides: 96 mg/dL (ref 0–149)
VLDL Cholesterol Cal: 17 mg/dL (ref 5–40)

## 2023-11-23 LAB — COMPREHENSIVE METABOLIC PANEL
ALT: 10 [IU]/L (ref 0–32)
AST: 17 [IU]/L (ref 0–40)
Albumin: 4.1 g/dL (ref 3.8–4.8)
Alkaline Phosphatase: 93 [IU]/L (ref 44–121)
BUN/Creatinine Ratio: 10 — ABNORMAL LOW (ref 12–28)
BUN: 12 mg/dL (ref 8–27)
Bilirubin Total: 0.5 mg/dL (ref 0.0–1.2)
CO2: 22 mmol/L (ref 20–29)
Calcium: 9.4 mg/dL (ref 8.7–10.3)
Chloride: 106 mmol/L (ref 96–106)
Creatinine, Ser: 1.16 mg/dL — ABNORMAL HIGH (ref 0.57–1.00)
Globulin, Total: 3.6 g/dL (ref 1.5–4.5)
Glucose: 171 mg/dL — ABNORMAL HIGH (ref 70–99)
Potassium: 4.8 mmol/L (ref 3.5–5.2)
Sodium: 143 mmol/L (ref 134–144)
Total Protein: 7.7 g/dL (ref 6.0–8.5)
eGFR: 50 mL/min/{1.73_m2} — ABNORMAL LOW (ref >59)

## 2023-12-08 DIAGNOSIS — Z1211 Encounter for screening for malignant neoplasm of colon: Secondary | ICD-10-CM | POA: Diagnosis not present

## 2023-12-16 LAB — COLOGUARD: COLOGUARD: NEGATIVE

## 2023-12-19 ENCOUNTER — Encounter: Payer: Self-pay | Admitting: Family Medicine

## 2024-01-03 ENCOUNTER — Encounter: Payer: Self-pay | Admitting: Internal Medicine

## 2024-01-25 ENCOUNTER — Ambulatory Visit: Payer: Medicare Other

## 2024-03-14 ENCOUNTER — Encounter (HOSPITAL_COMMUNITY): Payer: Self-pay

## 2024-03-20 ENCOUNTER — Ambulatory Visit (INDEPENDENT_AMBULATORY_CARE_PROVIDER_SITE_OTHER)

## 2024-03-20 DIAGNOSIS — Z0000 Encounter for general adult medical examination without abnormal findings: Secondary | ICD-10-CM

## 2024-03-20 NOTE — Progress Notes (Signed)
 Subjective:   Christy Scott is a 73 y.o. who presents for a Medicare Wellness preventive visit.  As a reminder, Annual Wellness Visits don't include a physical exam, and some assessments may be limited, especially if this visit is performed virtually. We may recommend an in-person visit if needed.  Visit Complete: Virtual I connected with  Christy Scott on 03/20/24 by a audio enabled telemedicine application and verified that I am speaking with the correct person using two identifiers.  Patient Location: Home  Provider Location: Office/Clinic  I discussed the limitations of evaluation and management by telemedicine. The patient expressed understanding and agreed to proceed.  Vital Signs: Because this visit was a virtual/telehealth visit, some criteria may be missing or patient reported. Any vitals not documented were not able to be obtained and vitals that have been documented are patient reported.  VideoError- Librarian, academic were attempted between this provider and patient, however failed, due to patient having technical difficulties OR patient did not have access to video capability.  We continued and completed visit with audio only.   Persons Participating in Visit: Patient.  AWV Questionnaire: No: Patient Medicare AWV questionnaire was not completed prior to this visit.  Cardiac Risk Factors include: advanced age (>78men, >29 women);diabetes mellitus;dyslipidemia;hypertension     Objective:     Today's Vitals   There is no height or weight on file to calculate BMI.     03/20/2024    8:52 AM 03/08/2023    1:32 PM 02/26/2022    2:01 PM 02/10/2021    9:50 AM 05/24/2017   10:34 AM  Advanced Directives  Does Patient Have a Medical Advance Directive? No No No No No  Would patient like information on creating a medical advance directive?    Yes (ED - Information included in AVS) Yes (MAU/Ambulatory/Procedural Areas - Information given)     Current Medications (verified) Outpatient Encounter Medications as of 03/20/2024  Medication Sig   amLODipine  (NORVASC ) 10 MG tablet Take 1 tablet (10 mg total) by mouth daily.   carvedilol  (COREG ) 25 MG tablet Take 1 tablet (25 mg total) by mouth 2 (two) times daily.   pioglitazone  (ACTOS ) 45 MG tablet Take 1 tablet (45 mg total) by mouth daily.   simvastatin  (ZOCOR ) 20 MG tablet Take 1 tablet (20 mg total) by mouth at bedtime.   sitaGLIPtin -metformin  (JANUMET ) 50-1000 MG tablet TAKE 1 TABLET BY MOUTH  TWICE DAILY WITH MEALS   valsartan -hydrochlorothiazide  (DIOVAN -HCT) 320-25 MG tablet Take 1 tablet by mouth daily.   No facility-administered encounter medications on file as of 03/20/2024.    Allergies (verified) Patient has no known allergies.   History: Past Medical History:  Diagnosis Date   Anemia    Diabetes mellitus    TYPEII   Dyslipidemia    Exogenous obesity    Hyperlipidemia    Hypertension    Menopausal symptoms    Past Surgical History:  Procedure Laterality Date   CHOLECYSTECTOMY  1978   Family History  Problem Relation Age of Onset   Diabetes Mother    Hypertension Mother    Stroke Mother    Hypertension Father    Stroke Father    Social History   Socioeconomic History   Marital status: Single    Spouse name: Not on file   Number of children: Not on file   Years of education: Not on file   Highest education level: Not on file  Occupational History   Not on file  Tobacco Use   Smoking status: Never   Smokeless tobacco: Never  Vaping Use   Vaping status: Never Used  Substance and Sexual Activity   Alcohol use: No   Drug use: No   Sexual activity: Not Currently  Other Topics Concern   Not on file  Social History Narrative   Not on file   Social Drivers of Health   Financial Resource Strain: Low Risk  (03/20/2024)   Overall Financial Resource Strain (CARDIA)    Difficulty of Paying Living Expenses: Not hard at all  Food Insecurity: No  Food Insecurity (03/20/2024)   Hunger Vital Sign    Worried About Running Out of Food in the Last Year: Never true    Ran Out of Food in the Last Year: Never true  Transportation Needs: No Transportation Needs (03/20/2024)   PRAPARE - Administrator, Civil Service (Medical): No    Lack of Transportation (Non-Medical): No  Physical Activity: Inactive (03/20/2024)   Exercise Vital Sign    Days of Exercise per Week: 0 days    Minutes of Exercise per Session: 0 min  Stress: No Stress Concern Present (03/20/2024)   [employer] of Occupational Health - Occupational Stress Questionnaire    Feeling of Stress : Not at all  Social Connections: Socially Isolated (03/20/2024)   Social Connection and Isolation Panel [NHANES]    Frequency of Communication with Friends and Family: More than three times a week    Frequency of Social Gatherings with Friends and Family: Not on file    Attends Religious Services: Never    Database administrator or Organizations: No    Attends Engineer, structural: Never    Marital Status: Never married    Tobacco Counseling Counseling given: Not Answered    Clinical Intake:  Pre-visit preparation completed: Yes  Pain : No/denies pain     Nutritional Risks: None Diabetes: Yes CBG done?: No Did pt. bring in CBG monitor from home?: No  Lab Results  Component Value Date   HGBA1C 6.8 (A) 11/22/2023   HGBA1C 7.4 (A) 05/25/2023   HGBA1C 6.8 (A) 08/19/2022     How often do you need to have someone help you when you read instructions, pamphlets, or other written materials from your doctor or pharmacy?: 1 - Never  Interpreter Needed?: No  Information entered by :: NAllen LPN   Activities of Daily Living     03/20/2024    8:47 AM  In your present state of health, do you have any difficulty performing the following activities:  Hearing? 0  Vision? 0  Difficulty concentrating or making decisions? 0  Walking or climbing stairs? 0   Dressing or bathing? 0  Doing errands, shopping? 0  Preparing Food and eating ? N  Using the Toilet? N  In the past six months, have you accidently leaked urine? N  Do you have problems with loss of bowel control? N  Managing your Medications? N  Managing your Finances? N  Housekeeping or managing your Housekeeping? N    Patient Care Team: Watson Hacking, MD as PCP - Willadean Hark, [facility]  Indicate any recent Medical Services you may have received from other than [facility] providers in the past year (date may be approximate).     Assessment:    This is a routine wellness examination for Christy Scott.  Hearing/Vision screen Hearing Screening - Comments:: Denies hearing issues Vision Screening - Comments:: Regular eye exams, [facility]  Goals Addressed             This Visit's Progress    Patient Stated       03/20/2024, wants to lose weight       Depression Screen     03/20/2024    8:55 AM 03/08/2023    1:34 PM 02/26/2022    2:03 PM 02/10/2021    9:53 AM 07/22/2020   11:01 AM 05/24/2017   10:13 AM 05/05/2015    9:22 AM  PHQ 2/9 Scores  PHQ - 2 Score 0 0 0 0 0 0 0  PHQ- 9 Score 0 0 0        Fall Risk     03/20/2024    8:54 AM 03/08/2023    1:33 PM 02/26/2022    2:02 PM 02/10/2021    9:52 AM 07/22/2020   11:01 AM  Fall Risk   Falls in the past year? 0 0 0 0 0  Number falls in past yr: 0 0 0 0   Injury with Fall? 0 0 0 0   Risk for fall due to : Medication side effect;Impaired mobility;Impaired balance/gait Medication side effect Medication side effect No Fall Risks   Follow up Falls prevention discussed;Falls evaluation completed Falls prevention discussed;Education provided;Falls evaluation completed Falls evaluation completed;Education provided;Falls prevention discussed Falls evaluation completed     MEDICARE RISK AT HOME:  Medicare Risk at Home Any stairs in or around the home?: Yes If so, are there any without handrails?: No Home free of loose throw  rugs in walkways, pet beds, electrical cords, etc?: Yes Adequate lighting in your home to reduce risk of falls?: Yes Life alert?: No Use of a cane, walker or w/c?: Yes Grab bars in the bathroom?: Yes Shower chair or bench in shower?: No Elevated toilet seat or a handicapped toilet?: Yes  TIMED UP AND GO:  Was the test performed?  No  Cognitive Function: 6CIT completed        03/20/2024    8:56 AM 03/08/2023    1:37 PM 02/26/2022    2:05 PM  6CIT Screen  What Year? 0 points 0 points 0 points  What month? 0 points 0 points 0 points  What time? 0 points 0 points 0 points  Count back from 20 0 points 0 points 0 points  Months in reverse 0 points 0 points 0 points  Repeat phrase 2 points 4 points 4 points  Total Score 2 points 4 points 4 points    Immunizations Immunization History  Administered Date(s) Administered   Fluad Quad(high Dose 65+) 08/14/2019, 07/22/2020, 08/19/2022   H1N1 11/18/2008   Influenza Split 08/02/2011, 08/25/2012   Influenza, High Dose Seasonal PF 08/09/2016, 09/07/2017, 09/01/2018, 08/04/2021, 09/10/2023   Influenza,inj,quad, With Preservative 08/26/2015   Influenza-Unspecified 08/26/2015, 09/01/2018   Moderna Covid-19 Vaccine Bivalent Booster 79yrs & up 09/22/2021   Moderna Sars-Covid-2 Vaccination 03/10/2020, 04/07/2020, 12/10/2020   PFIZER Comirnaty Martina Sledge Top)Covid-19 Tri-Sucrose Vaccine 08/19/2022   PNEUMOCOCCAL CONJUGATE-20 11/22/2023   Pfizer(Comirnaty )Fall Seasonal Vaccine 12 years and older 09/10/2023   Pneumococcal Conjugate-13 05/05/2015   Pneumococcal Polysaccharide-23 10/02/2007   Respiratory Syncytial Virus Vaccine,Recomb Aduvanted(Arexvy) 09/10/2023   Tdap 12/23/2008, 01/03/2019   Zoster Recombinant(Shingrix) 05/24/2017, 08/02/2017   Zoster, Live 08/02/2011    Screening Tests Health Maintenance  Topic Date Due   OPHTHALMOLOGY EXAM  08/26/2022   MAMMOGRAM  01/31/2023   Diabetic kidney evaluation - Urine ACR  08/20/2023   COVID-19  Vaccine (7 - Moderna risk 2024-25 season) 03/09/2024  HEMOGLOBIN A1C  05/21/2024   INFLUENZA VACCINE  06/08/2024   Diabetic kidney evaluation - eGFR measurement  11/21/2024   FOOT EXAM  11/21/2024   Medicare Annual Wellness (AWV)  03/20/2025   Fecal DNA (Cologuard)  12/07/2026   DTaP/Tdap/Td (3 - Td or Tdap) 01/03/2029   Pneumonia Vaccine 48+ Years old  Completed   DEXA SCAN  Completed   Hepatitis C Screening  Completed   Zoster Vaccines- Shingrix  Completed   HPV VACCINES  Aged Out   Meningococcal B Vaccine  Aged Out    Health Maintenance  Health Maintenance Due  Topic Date Due   OPHTHALMOLOGY EXAM  08/26/2022   MAMMOGRAM  01/31/2023   Diabetic kidney evaluation - Urine ACR  08/20/2023   COVID-19 Vaccine (7 - Moderna risk 2024-25 season) 03/09/2024   Health Maintenance Items Addressed: States will make appointment with eye doctor. Urine will be taken care of at appointment on 05/22/2024  Additional Screening:  Vision Screening: Recommended annual ophthalmology exams for early detection of glaucoma and other disorders of the eye.  Dental Screening: Recommended annual dental exams for proper oral hygiene  Community Resource Referral / Chronic Care Management: CRR required this visit?  No   CCM required this visit?  No   Plan:    I have personally reviewed and noted the following in the patient's chart:   Medical and social history Use of alcohol, tobacco or illicit drugs  Current medications and supplements including opioid prescriptions. Patient is not currently taking opioid prescriptions. Functional ability and status Nutritional status Physical activity Advanced directives List of other physicians Hospitalizations, surgeries, and ER visits in previous 12 months Vitals Screenings to include cognitive, depression, and falls Referrals and appointments  In addition, I have reviewed and discussed with patient certain preventive protocols, quality metrics, and  best practice recommendations. A written personalized care plan for preventive services as well as general preventive health recommendations were provided to patient.   Christy Beecham, LPN   1/61/0960   After Visit Summary: (MyChart) Due to this being a telephonic visit, the after visit summary with patients personalized plan was offered to patient via MyChart   Notes: Nothing significant to report at this time.

## 2024-03-20 NOTE — Patient Instructions (Signed)
 Ms. Keach , Thank you for taking time out of your busy schedule to complete your Annual Wellness Visit with me. I enjoyed our conversation and look forward to speaking with you again next year. I, as well as your care team,  appreciate your ongoing commitment to your health goals. Please review the following plan we discussed and let me know if I can assist you in the future. Your Game plan/ To Do List    Referrals: If you haven't heard from the office you've been referred to, please reach out to them at the phone provided.  N/a Follow up Visits: Next Medicare AWV with our clinical staff: 03/26/2025 at 8:50   Have you seen your provider in the last 6 months (3 months if uncontrolled diabetes)? Yes Next Office Visit with your provider: 05/22/2024 at 10:45  Clinician Recommendations:  Aim for 30 minutes of exercise or brisk walking, 6-8 glasses of water, and 5 servings of fruits and vegetables each day.       This is a list of the screening recommended for you and due dates:  Health Maintenance  Topic Date Due   Eye exam for diabetics  08/26/2022   Mammogram  01/31/2023   Yearly kidney health urinalysis for diabetes  08/20/2023   COVID-19 Vaccine (7 - Moderna risk 2024-25 season) 03/09/2024   Hemoglobin A1C  05/21/2024   Flu Shot  06/08/2024   Yearly kidney function blood test for diabetes  11/21/2024   Complete foot exam   11/21/2024   Medicare Annual Wellness Visit  03/20/2025   Cologuard (Stool DNA test)  12/07/2026   DTaP/Tdap/Td vaccine (3 - Td or Tdap) 01/03/2029   Pneumonia Vaccine  Completed   DEXA scan (bone density measurement)  Completed   Hepatitis C Screening  Completed   Zoster (Shingles) Vaccine  Completed   HPV Vaccine  Aged Out   Meningitis B Vaccine  Aged Out    Advanced directives: (ACP Link)Information on Advanced Care Planning can be found at [place]  Secretary of State Advance Health Care Directives Advance Health Care Directives. [email or web address]  Advance Care  Planning is important because it:  [x]  Makes sure you receive the medical care that is consistent with your values, goals, and preferences  [x]  It provides guidance to your family and loved ones and reduces their decisional burden about whether or not they are making the right decisions based on your wishes.  Follow the link provided in your after visit summary or read over the paperwork we have mailed to you to help you started getting your Advance Directives in place. If you need assistance in completing these, please reach out to us  so that we can help you!  See attachments for Preventive Care and Fall Prevention Tips.

## 2024-03-21 ENCOUNTER — Ambulatory Visit: Payer: Medicare Other | Admitting: Family Medicine

## 2024-03-21 ENCOUNTER — Ambulatory Visit

## 2024-03-26 ENCOUNTER — Other Ambulatory Visit: Payer: Self-pay | Admitting: Family Medicine

## 2024-03-26 DIAGNOSIS — E119 Type 2 diabetes mellitus without complications: Secondary | ICD-10-CM

## 2024-04-24 ENCOUNTER — Ambulatory Visit
Admission: RE | Admit: 2024-04-24 | Discharge: 2024-04-24 | Disposition: A | Discharge status: Home / Self Care | Source: Ambulatory Visit | Attending: Family Medicine | Admitting: Family Medicine

## 2024-04-24 DIAGNOSIS — Z1231 Encounter for screening mammogram for malignant neoplasm of breast: Secondary | ICD-10-CM | POA: Diagnosis not present

## 2024-05-22 ENCOUNTER — Ambulatory Visit: Admitting: Family Medicine

## 2024-06-14 ENCOUNTER — Other Ambulatory Visit: Payer: Self-pay | Admitting: Family Medicine

## 2024-06-14 DIAGNOSIS — E119 Type 2 diabetes mellitus without complications: Secondary | ICD-10-CM

## 2024-08-29 NOTE — Progress Notes (Signed)
 Delbra Zellars                                          MRN: [id number]   08/29/2024   The VBCI Quality Team Specialist reviewed this patient medical record for the purposes of chart review for care gap closure. The following were reviewed: abstraction for care gap closure-glycemic status assessment.    VBCI Quality Team

## 2024-09-18 ENCOUNTER — Ambulatory Visit: Admitting: Family Medicine

## 2024-10-22 DIAGNOSIS — E119 Type 2 diabetes mellitus without complications: Secondary | ICD-10-CM | POA: Insufficient documentation

## 2024-10-23 ENCOUNTER — Ambulatory Visit: Admitting: Family Medicine

## 2024-10-23 ENCOUNTER — Encounter: Payer: Self-pay | Admitting: Family Medicine

## 2024-10-23 DIAGNOSIS — E785 Hyperlipidemia, unspecified: Secondary | ICD-10-CM | POA: Diagnosis not present

## 2024-10-23 DIAGNOSIS — Z7984 Long term (current) use of oral hypoglycemic drugs: Secondary | ICD-10-CM | POA: Diagnosis not present

## 2024-10-23 DIAGNOSIS — E1169 Type 2 diabetes mellitus with other specified complication: Secondary | ICD-10-CM | POA: Diagnosis not present

## 2024-10-23 DIAGNOSIS — E1159 Type 2 diabetes mellitus with other circulatory complications: Secondary | ICD-10-CM

## 2024-10-23 DIAGNOSIS — I152 Hypertension secondary to endocrine disorders: Secondary | ICD-10-CM

## 2024-10-23 DIAGNOSIS — E1121 Type 2 diabetes mellitus with diabetic nephropathy: Secondary | ICD-10-CM

## 2024-10-23 LAB — LIPID PANEL
Chol/HDL Ratio: 2.3 ratio (ref 0.0–4.4)
Cholesterol, Total: 187 mg/dL (ref 100–199)
HDL: 83 mg/dL (ref >39)
LDL Chol Calc (NIH): 83 mg/dL (ref 0–99)
Triglycerides: 120 mg/dL (ref 0–149)
VLDL Cholesterol Cal: 21 mg/dL (ref 5–40)

## 2024-10-23 LAB — COMPREHENSIVE METABOLIC PANEL WITH GFR
ALT: 8 IU/L (ref 0–32)
AST: 16 IU/L (ref 0–40)
Albumin: 4.3 g/dL (ref 3.8–4.8)
Alkaline Phosphatase: 95 IU/L (ref 49–135)
BUN/Creatinine Ratio: 10 — ABNORMAL LOW (ref 12–28)
BUN: 14 mg/dL (ref 8–27)
Bilirubin Total: 0.5 mg/dL (ref 0.0–1.2)
CO2: 22 mmol/L (ref 20–29)
Calcium: 9.9 mg/dL (ref 8.7–10.3)
Chloride: 102 mmol/L (ref 96–106)
Creatinine, Ser: 1.39 mg/dL — ABNORMAL HIGH (ref 0.57–1.00)
Globulin, Total: 3.8 g/dL (ref 1.5–4.5)
Glucose: 185 mg/dL — ABNORMAL HIGH (ref 70–99)
Potassium: 4.4 mmol/L (ref 3.5–5.2)
Sodium: 139 mmol/L (ref 134–144)
Total Protein: 8.1 g/dL (ref 6.0–8.5)
eGFR: 40 mL/min/1.73 — ABNORMAL LOW (ref >59)

## 2024-10-23 LAB — CBC WITH DIFFERENTIAL/PLATELET
Basophils Absolute: 0 x10E3/uL (ref 0.0–0.2)
Basos: 0 %
EOS (ABSOLUTE): 0.2 x10E3/uL (ref 0.0–0.4)
Eos: 3 %
Hematocrit: 34.7 % (ref 34.0–46.6)
Hemoglobin: 10.8 g/dL — ABNORMAL LOW (ref 11.1–15.9)
Immature Grans (Abs): 0 x10E3/uL (ref 0.0–0.1)
Immature Granulocytes: 0 %
Lymphocytes Absolute: 1.3 x10E3/uL (ref 0.7–3.1)
Lymphs: 24 %
MCH: 30.4 pg (ref 26.6–33.0)
MCHC: 31.1 g/dL — ABNORMAL LOW (ref 31.5–35.7)
MCV: 98 fL — ABNORMAL HIGH (ref 79–97)
Monocytes Absolute: 0.3 x10E3/uL (ref 0.1–0.9)
Monocytes: 6 %
Neutrophils Absolute: 3.5 x10E3/uL (ref 1.4–7.0)
Neutrophils: 67 %
Platelets: 241 x10E3/uL (ref 150–450)
RBC: 3.55 x10E6/uL — ABNORMAL LOW (ref 3.77–5.28)
RDW: 14.7 % (ref 11.7–15.4)
WBC: 5.3 x10E3/uL (ref 3.4–10.8)

## 2024-10-23 LAB — POCT GLYCOSYLATED HEMOGLOBIN (HGB A1C): Hemoglobin A1C: 6.6 % — AB (ref 4.0–5.6)

## 2024-10-23 LAB — POCT UA - MICROALBUMIN
Albumin/Creatinine Ratio, Urine, POC: 84.6
Creatinine, POC: 66.8 mg/dL
Microalbumin Ur, POC: 33.8 mg/L

## 2024-10-23 NOTE — Progress Notes (Signed)
 Subjective:    Patient ID: Christy Scott, female    DOB: Aug 06, 1951, 73 y.o.   MRN: [id number]  Christy Scott is a 73 y.o. female who presents for follow-up of Type 2 diabetes mellitus.  Home blood sugar records: 120-150 Current symptoms/problems include none and have been unchanged. Daily foot checks: yes   Any foot concerns: no How often blood sugars checked: 1 x a week Exercise: The patient does not participate in regular exercise at present. Diet:regular Discussed the use of AI scribe software for clinical note transcription with the patient, who gave verbal consent to proceed.  She has been monitoring her blood pressure at home, noting recent readings of 136/84 mmHg, slightly elevated from January's 138/80 mmHg. She attributes occasional higher readings to lack of sleep and questions the accuracy of her home blood pressure cuff. She reports taking amlodipine , valsartan , and Coreg  for blood pressure control.  Her diabetes management includes Actos , Janumet , and metformin , with a recent A1c of 6.6%, which she finds satisfactory. She acknowledges dietary challenges, such as consuming a coconut cake, but generally feels her diabetes is well-controlled.  She has experienced a slight weight loss, from 335-340 lbs to 331 lbs, and humorously mentions a conversation about her weight.  She did  receive her flu and COVID vaccinations from [facility] as expected.      The following portions of the patient's history were reviewed and updated as appropriate: allergies, current medications, past medical history, past social history and problem list.  ROS as in subjective above.     Objective:    Physical Exam Alert and in no distress diabetic foot exam is normal and is recorded. Hemoglobin A1c is 6.6  Lab Review    Latest Ref Rng & Units 11/22/2023   11:37 AM 11/22/2023   11:20 AM 05/25/2023    2:49 PM 08/19/2022    4:21 PM 08/19/2022   11:23 AM  Diabetic Labs  HbA1c 4.0 - 5.6 %  6.8   7.4  6.8    Microalbumin mg/L    13.9    Micro/Creat Ratio     12.0    Chol 100 - 199 mg/dL 820     791   HDL >60 mg/dL 83     88   Calc LDL 0 - 99 mg/dL 79     898   Triglycerides 0 - 149 mg/dL 96     891   Creatinine 0.57 - 1.00 mg/dL 8.83     8.77       4/86/7974    8:45 AM 11/22/2023   10:38 AM 05/25/2023    2:34 PM 03/08/2023    1:27 PM 08/19/2022   10:23 AM  BP/Weight  Systolic BP -- 138 138 -- 142  Diastolic BP -- 80 84 -- 80  Wt. (Lbs) -- 340.6 335.4 332 333.8  BMI  60.33 kg/m2 59.41 kg/m2 58.81 kg/m2 59.13 kg/m2      11/22/2023   10:45 AM 08/19/2022   10:15 AM  Foot/eye exam completion dates  Foot Form Completion Done Done    Christy Scott  reports that she has never smoked. She has never used smokeless tobacco. She reports that she does not drink alcohol and does not use drugs.     Assessment & Plan:    Hypertension associated with diabetes (HCC)  Hyperlipidemia associated with type 2 diabetes mellitus (HCC)  Diabetic nephropathy associated with type 2 diabetes mellitus (HCC)  Type 2 diabetes mellitus with circulatory and other specified complications Diabetes  well-controlled with A1c of 6.6%. Declined Mounjaro due to cost and satisfaction with current regimen. - Continue Actos , Janumet , and Januvia . - Follow-up in six months.  Morbid obesity Weight decreased from 340 lbs to 331 lbs. Declined Mounjaro due to cost and satisfaction with current management.  General health maintenance Immunizations up to date. Recommended RSV vaccination. - Obtain RSV vaccination at the drugstore.

## 2024-10-24 ENCOUNTER — Ambulatory Visit: Payer: Self-pay | Admitting: Family Medicine

## 2024-10-24 NOTE — Progress Notes (Signed)
 Called Patient and went over results.

## 2024-10-25 ENCOUNTER — Other Ambulatory Visit: Payer: Self-pay | Admitting: Family Medicine

## 2024-10-25 DIAGNOSIS — E1159 Type 2 diabetes mellitus with other circulatory complications: Secondary | ICD-10-CM

## 2024-10-25 DIAGNOSIS — E1169 Type 2 diabetes mellitus with other specified complication: Secondary | ICD-10-CM

## 2025-03-26 ENCOUNTER — Ambulatory Visit: Payer: Self-pay

## 2025-04-23 ENCOUNTER — Ambulatory Visit: Admitting: Family Medicine
